# Patient Record
Sex: Male | Born: 1990 | Race: Black or African American | Hispanic: No | Marital: Single | Smoking: Current some day smoker
Health system: Southern US, Community
[De-identification: ages and names within clinical notes are randomized; demographics above are authoritative.]

## PROBLEM LIST (undated history)

## (undated) DIAGNOSIS — Z21 Asymptomatic human immunodeficiency virus [HIV] infection status: Secondary | ICD-10-CM

## (undated) DIAGNOSIS — Z87891 Personal history of nicotine dependence: Secondary | ICD-10-CM

## (undated) DIAGNOSIS — B20 Human immunodeficiency virus [HIV] disease: Secondary | ICD-10-CM

## (undated) HISTORY — DX: Personal history of nicotine dependence: Z87.891

## (undated) HISTORY — PX: THORACOTOMY: SUR1349

---

## 2011-11-20 ENCOUNTER — Ambulatory Visit: Payer: Self-pay

## 2012-02-09 ENCOUNTER — Emergency Department (HOSPITAL_BASED_OUTPATIENT_CLINIC_OR_DEPARTMENT_OTHER)
Admission: EM | Admit: 2012-02-09 | Discharge: 2012-02-10 | Disposition: A | Discharge status: Home / Self Care | Payer: Self-pay | Attending: Emergency Medicine | Admitting: Emergency Medicine

## 2012-02-09 ENCOUNTER — Emergency Department (HOSPITAL_BASED_OUTPATIENT_CLINIC_OR_DEPARTMENT_OTHER)
Admission: EM | Admit: 2012-02-09 | Discharge: 2012-02-09 | Disposition: A | Discharge status: Home / Self Care | Payer: Self-pay | Attending: Emergency Medicine | Admitting: Emergency Medicine

## 2012-02-09 ENCOUNTER — Encounter (HOSPITAL_BASED_OUTPATIENT_CLINIC_OR_DEPARTMENT_OTHER): Payer: Self-pay | Admitting: *Deleted

## 2012-02-09 ENCOUNTER — Emergency Department (INDEPENDENT_AMBULATORY_CARE_PROVIDER_SITE_OTHER): Payer: Self-pay

## 2012-02-09 DIAGNOSIS — J40 Bronchitis, not specified as acute or chronic: Secondary | ICD-10-CM | POA: Insufficient documentation

## 2012-02-09 DIAGNOSIS — J069 Acute upper respiratory infection, unspecified: Secondary | ICD-10-CM

## 2012-02-09 DIAGNOSIS — R071 Chest pain on breathing: Secondary | ICD-10-CM

## 2012-02-09 DIAGNOSIS — R509 Fever, unspecified: Secondary | ICD-10-CM | POA: Insufficient documentation

## 2012-02-09 DIAGNOSIS — F17200 Nicotine dependence, unspecified, uncomplicated: Secondary | ICD-10-CM | POA: Insufficient documentation

## 2012-02-09 DIAGNOSIS — R52 Pain, unspecified: Secondary | ICD-10-CM

## 2012-02-09 DIAGNOSIS — B370 Candidal stomatitis: Secondary | ICD-10-CM | POA: Insufficient documentation

## 2012-02-09 LAB — CBC
HCT: 34.5 % — ABNORMAL LOW (ref 39.0–52.0)
Hemoglobin: 12.5 g/dL — ABNORMAL LOW (ref 13.0–17.0)
MCH: 29.6 pg (ref 26.0–34.0)
MCHC: 36.2 g/dL — ABNORMAL HIGH (ref 30.0–36.0)
MCV: 81.6 fL (ref 78.0–100.0)
Platelets: 343 10*3/uL (ref 150–400)
RBC: 4.23 MIL/uL (ref 4.22–5.81)
RDW: 12.7 % (ref 11.5–15.5)
WBC: 15.4 10*3/uL — ABNORMAL HIGH (ref 4.0–10.5)

## 2012-02-09 LAB — URINALYSIS, ROUTINE W REFLEX MICROSCOPIC
Bilirubin Urine: NEGATIVE
Glucose, UA: NEGATIVE mg/dL
Hgb urine dipstick: NEGATIVE
Ketones, ur: NEGATIVE mg/dL
Leukocytes, UA: NEGATIVE
Nitrite: NEGATIVE
Protein, ur: NEGATIVE mg/dL
Specific Gravity, Urine: 1.01 (ref 1.005–1.030)
Urobilinogen, UA: 1 mg/dL (ref 0.0–1.0)
pH: 6 (ref 5.0–8.0)

## 2012-02-09 LAB — DIFFERENTIAL
Basophils Absolute: 0 10*3/uL (ref 0.0–0.1)
Basophils Relative: 0 % (ref 0–1)
Eosinophils Absolute: 0 10*3/uL (ref 0.0–0.7)
Eosinophils Relative: 0 % (ref 0–5)
Lymphocytes Relative: 16 % (ref 12–46)
Lymphs Abs: 2.5 10*3/uL (ref 0.7–4.0)
Monocytes Absolute: 1.8 10*3/uL — ABNORMAL HIGH (ref 0.1–1.0)
Monocytes Relative: 12 % (ref 3–12)
Neutro Abs: 11.1 10*3/uL — ABNORMAL HIGH (ref 1.7–7.7)
Neutrophils Relative %: 72 % (ref 43–77)

## 2012-02-09 LAB — T-HELPER CELLS (CD4) COUNT (NOT AT ARMC)
CD4 % Helper T Cell: 20 % — ABNORMAL LOW (ref 33–55)
CD4 T Cell Abs: 530 uL (ref 400–2700)

## 2012-02-09 LAB — LACTATE DEHYDROGENASE: LDH: 254 U/L — ABNORMAL HIGH (ref 94–250)

## 2012-02-09 LAB — MONONUCLEOSIS SCREEN: Mono Screen: NEGATIVE

## 2012-02-09 MED ORDER — ACETAMINOPHEN 325 MG PO TABS
650.0000 mg | ORAL_TABLET | Freq: Once | ORAL | Status: AC
  Administered 2012-02-09: 650 mg via ORAL

## 2012-02-09 MED ORDER — ACETAMINOPHEN 325 MG PO TABS
ORAL_TABLET | ORAL | Status: AC
  Administered 2012-02-09: 650 mg via ORAL
  Filled 2012-02-09: qty 2

## 2012-02-09 MED ORDER — NAPROXEN 250 MG PO TABS
375.0000 mg | ORAL_TABLET | Freq: Once | ORAL | Status: AC
  Administered 2012-02-09: 375 mg via ORAL
  Filled 2012-02-09: qty 2

## 2012-02-09 MED ORDER — ACETAMINOPHEN 325 MG PO TABS
ORAL_TABLET | ORAL | Status: AC
  Filled 2012-02-09: qty 2

## 2012-02-09 NOTE — ED Provider Notes (Signed)
History     CSN: [id number]  Arrival date & time 02/09/12  4098   First MD Initiated Contact with Patient 02/09/12 0057      Chief Complaint  Patient presents with  . 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 He is here with a chief complaint of fever. The fevers began about a week ago and a been waxing and waning. He took Tylenol earlier in the week with relief of fever. He is here this morning do to return of the fever. It was noted to be 103.2 here. He is not sure how high it has been at home. He did have some diarrhea earlier in the week but that is resolved. He had a sore throat earlier in the week but states that is also resolved. He denies cough, dyspnea, nausea, vomiting, abdominal pain, dysuria or nasal congestion.  History reviewed. No pertinent past medical history.  History reviewed. No pertinent past surgical history.  History reviewed. No pertinent family history.  History  Substance Use Topics  . Smoking status: Current Everyday Smoker    Types: Cigarettes  . Smokeless tobacco: Not on file  . Alcohol Use:       Review of Systems  All other systems reviewed and are negative.    Allergies  Review of patient's allergies indicates no known allergies.  Home Medications  No current outpatient prescriptions on file.  BP 120/72  Pulse 113  Temp(Src) 101 F (38.3 C) (Oral)  Resp 16  Ht 5\' 7"  (1.702 m)  Wt 145 lb (65.772 kg)  BMI 22.71 kg/m2  SpO2 98%  Physical Exam General: Well-developed, well-nourished male in no acute distress; appearance consistent with age of record HENT: normocephalic, atraumatic; no nasal congestion; pharyngeal erythema Eyes: pupils equal round and reactive to light; extraocular muscles intact Neck: supple; anterior and posterior cervical  lymphadenopathy Heart: regular rate and rhythm; tachycardia Lungs: clear to auscultation bilaterally Abdomen: soft; nondistended; nontender; bowel sounds present Extremities: No deformity; full range of motion; pulses normal Neurologic: Awake, alert and oriented; motor function intact in all extremities and symmetric; no facial droop Skin: Warm and dry Psychiatric: Normal mood and affect    ED Course  Procedures (including critical care time)     MDM   Nursing notes and vitals signs, including pulse oximetry, reviewed.  Summary of this visit's results, reviewed by myself:  Labs:  Results for orders placed during the hospital encounter of 02/09/12  RAPID STREP SCREEN      Component Value Range   Streptococcus, Group A Screen (Direct) NEGATIVE  NEGATIVE   CBC      Component Value Range   WBC 15.4 (*) 4.0 - 10.5 (K/uL)   RBC 4.23  4.22 - 5.81 (MIL/uL)   Hemoglobin 12.5 (*) 13.0 - 17.0 (g/dL)   HCT 11.9 (*) 14.7 - 52.0 (%)   MCV 81.6  78.0 - 100.0 (fL)   MCH 29.6  26.0 - 34.0 (pg)   MCHC 36.2 (*) 30.0 - 36.0 (g/dL)   RDW 82.9  56.2 - 13.0 (%)   Platelets 343  150 - 400 (K/uL)  DIFFERENTIAL      Component Value Range   Neutrophils Relative 72  43 - 77 (%)   Lymphocytes Relative 16  12 - 46 (%)   Monocytes Relative 12  3 - 12 (%)   Eosinophils Relative 0  0 - 5 (%)   Basophils Relative 0  0 - 1 (%)   Neutro Abs 11.1 (*) 1.7 - 7.7 (K/uL)   Lymphs Abs 2.5  0.7 - 4.0 (K/uL)   Monocytes Absolute 1.8 (*) 0.1 - 1.0 (K/uL)   Eosinophils Absolute 0.0  0.0 - 0.7 (K/uL)   Basophils Absolute 0.0  0.0 - 0.1 (K/uL)   WBC Morphology ATYPICAL LYMPHOCYTES    MONONUCLEOSIS SCREEN      Component Value Range   Mono Screen NEGATIVE  NEGATIVE   URINALYSIS, ROUTINE W REFLEX MICROSCOPIC      Component Value Range   Color, Urine YELLOW  YELLOW    APPearance CLEAR  CLEAR    Specific Gravity, Urine 1.010  1.005 - 1.030    pH 6.0  5.0 - 8.0    Glucose, UA NEGATIVE  NEGATIVE (mg/dL)   Hgb  urine dipstick NEGATIVE  NEGATIVE    Bilirubin Urine NEGATIVE  NEGATIVE    Ketones, ur NEGATIVE  NEGATIVE (mg/dL)   Protein, ur NEGATIVE  NEGATIVE (mg/dL)   Urobilinogen, UA 1.0  0.0 - 1.0 (mg/dL)   Nitrite NEGATIVE  NEGATIVE    Leukocytes, UA NEGATIVE  NEGATIVE     Imaging Studies: Dg Chest 2 View  02/09/2012  *RADIOLOGY REPORT*  Clinical Data: Fever of unknown origin and body aches for 4 days.  CHEST - 2 VIEW  Comparison: None.  Findings: The heart size and pulmonary vascularity are normal. The lungs appear clear and expanded without focal air space disease or consolidation. No blunting of the costophrenic angles.  No pneumothorax.  IMPRESSION: No evidence of active pulmonary disease.  Original Report Authenticated By: Marlon Pel, M.D.   3:28 AM Patient continues to be awake, alert and in no acute distress. HIV viral load and CD4 count have been ordered and are pending. The flow manager was contacted regarding close followup in the [facility] infectious disease clinic. At this point there is no indication for acute admission to the hospital. He was advised to continue over-the-counter antipyretics as needed, but to return for difficulty breathing, lethargy or other worsening symptoms.        Hanley Seamen, MD 02/09/12 [phone]

## 2012-02-09 NOTE — Discharge Instructions (Signed)
Fever, Adult A fever is a higher than normal body temperature. In an adult, an oral temperature around 98.6 F (37 C) is considered normal. A temperature of 100.4 F (38 C) or higher is generally considered a fever. Mild or moderate fevers generally have no long-term effects and often do not require treatment. Extreme fever (greater than or equal to 106 F or 41.1 C) can cause seizures. The sweating that may occur with repeated or prolonged fever may cause dehydration. Elderly people can develop confusion during a fever. A measured temperature can vary with:  Age.   Time of day.   Method of measurement (mouth, underarm, rectal, or ear).  The fever is confirmed by taking a temperature with a thermometer. Temperatures can be taken different ways. Some methods are accurate and some are not.  An oral temperature is used most commonly. Electronic thermometers are fast and accurate.   An ear temperature will only be accurate if the thermometer is positioned as recommended by the manufacturer.   A rectal temperature is accurate and done for those adults who have a condition where an oral temperature cannot be taken.   An underarm (axillary) temperature is not accurate and not recommended.  Fever is a symptom, not a disease.  CAUSES   Infections commonly cause fever.   Some noninfectious causes for fever include:   Some arthritis conditions.   Some thyroid or adrenal gland conditions.   Some immune system conditions.   Some types of cancer.   A medicine reaction.   High doses of certain street drugs such as methamphetamine.   Dehydration.   Exposure to high outside or room temperatures.   Occasionally, the source of a fever cannot be determined. This is sometimes called a "fever of unknown origin" (FUO).   Some situations may lead to a temporary rise in body temperature that may go away on its own. Examples are:   Surgery.   Intense exercise.  HOME CARE INSTRUCTIONS    Take appropriate medicines for fever. Follow dosing instructions carefully. If you use acetaminophen to reduce the fever, be careful to avoid taking other medicines that also contain acetaminophen.   If an infection is present and antibiotics have been prescribed, take them as directed. Finish them even if you start to feel better.   Rest as needed.   Maintain an adequate fluid intake. To prevent dehydration during an illness with prolonged or recurrent fever, you may need to drink extra fluid.Drink enough fluids to keep your urine clear or pale yellow.   Sponging or bathing with room temperature water may help reduce body temperature. Do not use ice water or alcohol sponge baths.   Dress comfortably, but do not over-bundle.  SEEK MEDICAL CARE IF:   You are unable to keep fluids down.   You develop vomiting or diarrhea.   You are not feeling at least partly better after 3 days.   You develop new symptoms or problems.  SEEK IMMEDIATE MEDICAL CARE IF:   You have shortness of breath or trouble breathing.   You develop excessive weakness.   You are dizzy or you faint.   You are extremely thirsty or you are making little or no urine.   You develop new pain that was not there before (such as in the head, neck, chest, back, or abdomen).   You have persistant vomiting and diarrhea for more than 1 to 2 days.   You develop a stiff neck or your eyes become sensitive to  light.   You develop a skin rash.   You have a fever or persistent symptoms for more than 2 to 3 days.   You have a fever and your symptoms suddenly get worse.  MAKE SURE YOU:   Understand these instructions.   Will watch your condition.   Will get help right away if you are not doing well or get worse.  Document Released: 03/11/2001 Document Revised: 09/04/2011 Document Reviewed: 07/17/2011 [facility] Patient Information 2012 [place], [place].

## 2012-02-09 NOTE — ED Notes (Signed)
Pt reports pain to right flank, states it feels like it is under his rib cage. No meds took at home. Pain started about 5 am this morning. Pain depends on position of pt, pt states he can not take a deep breath because it hurts so bad.

## 2012-02-09 NOTE — ED Notes (Signed)
Pain in his right ribs. Was seen last night for fever.

## 2012-02-09 NOTE — ED Notes (Signed)
Pt presents to ED today with fever of unknown origin for the last 4 days.  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  Pt was taking tylenol at home with last dose 3 days ago.  Pt has not informed family of HIV status.  Pt has fever in triage and c/o generalized body aches.

## 2012-02-09 NOTE — ED Provider Notes (Signed)
History    This chart was scribed for Kalisha Keadle Smitty Cords, MD, MD by Smitty Pluck. The patient was seen in room [facility] and the patient's care was started at 11:46PM.   CSN: [id number]  Arrival date & time 02/09/12  2252   First MD Initiated Contact with Patient 02/09/12 2343      Chief Complaint  Patient presents with  . URI    (Consider location/radiation/quality/duration/timing/severity/associated sxs/prior treatment) Patient is a 21 y.o. male presenting with URI. The history is provided by the patient.  URI The primary symptoms include fever and cough. Primary symptoms do not include fatigue, headaches, ear pain, swollen glands, wheezing, nausea, vomiting, myalgias, arthralgias or rash. The current episode started 6 to 7 days ago. This is a new problem. The problem has not changed since onset. Associated with: none. The illness is not associated with chills, plugged ear sensation, facial pain, sinus pressure, congestion or rhinorrhea. Risk factors for severe complications from URI include immunosuppression.   Adarius Tigges is a 21 y.o. male who presents to the Emergency Department complaining of moderate cough onset today. Pt was seen in ED last night for fever that has subsided since then. He has right side rib pain. He has not taken any medication PTA. Symptoms have been constant without radiation. He reports having sore throat and loss of appetite.   History reviewed. No pertinent past medical history.  History reviewed. No pertinent past surgical history.  No family history on file.  History  Substance Use Topics  . Smoking status: Current Everyday Smoker    Types: Cigarettes  . Smokeless tobacco: Not on file  . Alcohol Use:       Review of Systems  Constitutional: Positive for fever. Negative for chills and fatigue.  HENT: Negative for ear pain, congestion, rhinorrhea, neck pain, neck stiffness and sinus pressure.   Eyes: Negative for photophobia.  Respiratory:  Positive for cough. Negative for wheezing.   Gastrointestinal: Negative for nausea and vomiting.  Musculoskeletal: Negative for myalgias and arthralgias.  Skin: Negative for rash.  Neurological: Negative for headaches.  All other systems reviewed and are negative.   10 Systems reviewed and all are negative for acute change except as noted in the HPI.   Allergies  Review of patient's allergies indicates no known allergies.  Home Medications   Current Outpatient Rx  Name Route Sig Dispense Refill  . ACETAMINOPHEN 500 MG PO TABS Oral Take 500 mg by mouth every 6 (six) hours as needed. Patient used this medication for his fever.      BP 100/60  Pulse 109  Temp(Src) 99.8 F (37.7 C) (Oral)  Resp 18  Wt 145 lb (65.772 kg)  SpO2 100%  Physical Exam  Nursing note and vitals reviewed. Constitutional: He is oriented to person, place, and time. He appears well-developed and well-nourished. No distress.  HENT:  Head: Normocephalic and atraumatic.       Pt has thrush of tongue  Eyes: Conjunctivae are normal. Pupils are equal, round, and reactive to light.  Neck: Normal range of motion. Neck supple. No tracheal deviation present.       Trachea is midline  Cardiovascular: Normal rate, regular rhythm and normal heart sounds.   Pulmonary/Chest: Effort normal and breath sounds normal. No respiratory distress. He has no wheezes. He exhibits tenderness (right rib ).  Abdominal: Soft. He exhibits no distension. There is no tenderness. There is no rebound.  Musculoskeletal: Normal range of motion. He exhibits no tenderness.  Lymphadenopathy:  He has no cervical adenopathy.  Neurological: He is alert and oriented to person, place, and time.  Skin: Skin is warm and dry.  Psychiatric: He has a normal mood and affect. His behavior is normal.    ED Course  Procedures (including critical care time) DIAGNOSTIC STUDIES: Oxygen Saturation is 100% on room air, normal by my interpretation.     COORDINATION OF CARE: 11:51PM EDP discusses pt ED treatment with pt   Labs Reviewed - No data to display Dg Chest 2 View  02/10/2012  *RADIOLOGY REPORT*  Clinical Data: Pleuritic right chest pain.  CHEST - 2 VIEW  Comparison: 02/09/2012 at 1:38 a.m.  Findings: Cardiac and mediastinal contours appear normal.  The lungs appear clear.  No pleural effusion is identified.  IMPRESSION:  No significant abnormality identified.  Original Report Authenticated By: Dellia Cloud, M.D.   Dg Chest 2 View  02/09/2012  *RADIOLOGY REPORT*  Clinical Data: Fever of unknown origin and body aches for 4 days.  CHEST - 2 VIEW  Comparison: None.  Findings: The heart size and pulmonary vascularity are normal. The lungs appear clear and expanded without focal air space disease or consolidation. No blunting of the costophrenic angles.  No pneumothorax.  IMPRESSION: No evidence of active pulmonary disease.  Original Report Authenticated By: Marlon Pel, M.D.     No diagnosis found.    MDM  Given week of fevers and cough will treat for bronchitis and and chest wall pain related to same.  Will also treat for thrush.  Return for chest pain, shortness or breath, fevers, or any  Concerns.  Follow up with your family doctor in 2 days for recheck take all antibiotics as directed.    I personally performed the services described in this documentation, which was scribed in my presence. The recorded information has been reviewed and considered.         Jasmine Awe, MD 02/10/12 [phone]

## 2012-02-10 LAB — HIV-1 RNA QUANT-NO REFLEX-BLD
HIV 1 RNA Quant: 8876 copies/mL — ABNORMAL HIGH (ref <20)
HIV-1 RNA Quant, Log: 3.95 {Log} — ABNORMAL HIGH (ref <1.30)

## 2012-02-10 MED ORDER — LEVOFLOXACIN 750 MG PO TABS: 750.0000 mg | ORAL_TABLET | Freq: Every day | ORAL | Status: AC

## 2012-02-10 MED ORDER — IBUPROFEN 800 MG PO TABS: 800.0000 mg | ORAL_TABLET | Freq: Three times a day (TID) | ORAL | Status: AC

## 2012-02-10 MED ORDER — NYSTATIN 100000 UNIT/ML MT SUSP: 500000.0000 [IU] | Freq: Four times a day (QID) | OROMUCOSAL | Status: AC

## 2012-02-10 NOTE — ED Notes (Signed)
Jason at [facility] was notified about Positive results and he will have field service person contact [place] for follow up.

## 2012-02-10 NOTE — Discharge Instructions (Signed)
Bronchitis Bronchitis is a problem of the air tubes leading to your lungs. This problem makes it hard for air to get in and out of the lungs. You may cough a lot because your air tubes are narrow. Going without care can cause lasting (chronic) bronchitis. HOME CARE   Drink enough fluids to keep your pee (urine) clear or pale yellow.   Use a cool mist humidifier.   Quit smoking if you smoke. If you keep smoking, the bronchitis might not get better.   Only take medicine as told by your doctor.  GET HELP RIGHT AWAY IF:   Coughing keeps you awake.   You start to wheeze.   You become more sick or weak.   You have a hard time breathing or get short of breath.   You cough up blood.   Coughing lasts more than 2 weeks.   You have a fever.   Your baby is older than 3 months with a rectal temperature of 102 F (38.9 C) or higher.   Your baby is 57 months old or younger with a rectal temperature of 100.4 F (38 C) or higher.  MAKE SURE YOU:  Understand these instructions.   Will watch your condition.   Will get help right away if you are not doing well or get worse.  Document Released: 03/03/2008 Document Revised: 09/04/2011 Document Reviewed: 08/17/2009 [facility] Patient Information 2012 [place], [place].Thrush, Adult  Douglas Valenzuela is a yeast infection that develops in the mouth and throat and on the tongue. The medical term for this is oropharyngeal candidiasis, or OPC. Douglas Valenzuela is most common in older adults, but it can occur at any age. Douglas Valenzuela occurs when a yeast called candida grows out of control. Candida normally is present in small amounts in the mouth and on other mucous membranes. However, under certain circumstances, candida can grow rapidly, causing thrush. Douglas Valenzuela can be a recurring problem for people who have chronic illnesses or who take medications that limit the body's ability to fight infection (weakened immune system). Since these people have difficulty fighting infections, the  fungus that causes thrush can spread throughout the body. This can cause life-threatening blood or organ infections. CAUSES  Candida, the yeast that causes thrush, is normally present in small amounts in the mouth and on other mucous membranes. It usually causes no harm. However, when conditions are present that allow the yeast to grow uncontrolled, it invades surrounding tissues and becomes an infection. Douglas Valenzuela is most commonly caused by the yeast Candida albicans. Less often, other forms of candida can lead to thrush. There are many types of bacteria in your mouth that normally control the growth of candida. Sometimes a new type of bacteria gets into your mouth and disrupts the balance of the germs already there. This can allow candida to overgrow. Other factors that increase your risk of developing thrush include:  An impaired ability to fight infection (weakened immune system). A normal immune system is usually strong enough to prevent candida from overgrowing.   Older adults are more likely to develop thrush because they may have weaker immune systems.   People with human immunodeficiency virus (HIV) infection have a high likelihood of developing thrush. About 90% of people with HIV develop thrush at some point during the course of their disease.   People with diabetes are more likely to get thrush because high blood sugar levels promote overgrowth of the candida fungus.   A dry mouth (xerostomia). Dry mouth can result from overuse of  mouthwashes or from certain conditions such as Sjgren's syndrome.   Pregnancy. Hormone changes during pregnancy can lead to thrush by altering the balance of bacteria in the mouth.   Poor dental care, especially in people who have false teeth.   The use of antibiotic medications. This may lead to thrush by changing the balance of bacteria in the mouth.  SYMPTOMS  Thrush can be a mild infection that causes no symptoms. If symptoms develop, they may include the  following:  A burning feeling in the mouth and throat. This can occur at the start of a thrush infection.   White patches that adhere to the mouth and tongue. The tissue around the patches may be red, raw, and painful. If rubbed (during tooth brushing, for example), the patches and the tissue of the mouth may bleed easily.   A bad taste in the mouth or difficulty tasting foods.   Cottony feeling in the mouth.   Sometimes pain during eating and swallowing.  DIAGNOSIS  Your caregiver can usually diagnose thrush by exam. In addition to looking in your mouth, your caregiver will ask you questions about your health. TREATMENT  Medications that help prevent the growth of fungi (antifungals) are the standard treatment for thrush. These medications are either applied directly to the affected area (topical) or swallowed (oral). Mild thrush In adults, mild cases of thrush may clear up with simple treatment that can be done at home. This treatment usually involves using an antifungal mouth rinse or lozenges. Treatment usually lasts about 14 days. Moderate to severe thrush  More severe thrush infections that have spread to the esophagus are treated with an oral antifungal medication. A topical antifungal medication may also be used.   For some severe infections, a treatment period longer than 14 days may be needed.   Oral antifungal medications are almost never used during pregnancy because the fetus may be harmed. However, if a pregnant woman has a rare, severe thrush infection that has spread to her blood, oral antifungal medications may be used. In this case, the risk of harm to the mother and fetus from the severe thrush infection may be greater than the risk posed by the use of antifungal medications.  Persistent or recurrent thrush Persistent (does not go away) or recurrent (keeps coming back) cases of thrush may:  Need to be treated twice as long as the symptoms last.   Require treatment with  both oral and topical antifungal medications.   People with weakened immune systems can take an antifungal medication on a continuous basis to prevent thrush infections.  It is important to treat conditions that make you more likely to get thrush, such as diabetes, human immunodeficiency virus (HIV), or cancer.  HOME CARE INSTRUCTIONS   If you are breast-feeding, you should clean your nipples with an antifungal medication, such as nystatin (Mycostatin). Dry your nipples after breast-feeding. Applying lanolin-containing body lotion may help relieve nipple soreness.   If you wear dentures and get thrush, remove dentures before going to bed, brush them vigorously, and soak in a solution of chlorhexidine gluconate or a product such as Polident or Efferdent.   Eating plain, unflavored yogurt that contains live cultures (check the label) can also help cure thrush. Yogurt helps healthy bacteria grow in the mouth. These bacteria stop the growth of the yeast that causes thrush.   Adults can treat thrush at home with gentian violet (1%), a dye that kills bacteria and fungi. It is available without a  prescription. If there is no known cause for the infection or if gentian violet does not cure the thrush, you need to see your caregiver.  Comfort measures Measures can be taken to reduce the discomfort of thrush:  Drink cold liquids such as water or iced tea. Eat flavored ice treats or frozen juices.   Eat foods that are easy to swallow such as gelatin, ice cream, or custard.   If the patches are painful, try drinking from a straw.   Rinse your mouth several times a day with a warm saltwater rinse. You can make the saltwater mixture with 1 tsp (5 g) of salt in 8 fl oz (0.2 L) of warm water.  PROGNOSIS   Most cases of thrush are mild and clear up with the use of an antifungal mouth rinse or lozenges. Very mild cases of thrush may clear up without medical treatment. It usually takes about 14 days of  treatment with an oral antifungal medication to cure more severe thrush infections. In some cases, thrush may last several weeks even with treatment.   If thrush goes untreated and does not go away by itself, it can spread to other parts of the body.   Thrush can spread to the throat, the vagina, or the skin. It rarely spreads to other organs of the body.  Douglas Valenzuela is more likely to recur (come back) in:  People who use inhaled corticosteroids to treat asthma.   People who take antibiotic medications for a long time.   People who have false teeth.   People who have a weakened immune system.  RISKS AND COMPLICATIONS Complications related to thrush are rare in healthy people. There are several factors that can increase your risk of developing thrush. Age Older adults, especially those who have serious health problems, are more likely to develop thrush because their immune systems are likely to be weaker. Behavior  The yeast that causes thrush can be spread by oral sex.   Heavy smoking can lower the body's ability to fight off infections. This makes thrush more likely to develop.  Other conditions  False teeth (dentures), braces, or a retainer that irritates the mouth make it hard to keep the mouth clean. An unclean mouth is more likely to develop thrush than a clean mouth.   People with a weakened immune system, such as those who have diabetes or human immunodeficiency virus (HIV) or who are undergoing chemotherapy, have an increased risk for developing thrush.  Medications Some medications can allow the fungus that causes thrush to grow uncontrolled. Common ones are:  Antibiotics, especially those that kill a wide range of organisms (broad-spectrum antibiotics), such as tetracycline commonly can cause thrush.   Birth control pills (oral contraceptives).   Medications that weaken the body's immune system, such as corticosteroids.  Environment Exposure over time to certain  environmental chemicals, such as benzene and pesticides, can weaken the body's immune system. This increases your risk for developing infections, including thrush. SEEK IMMEDIATE MEDICAL CARE IF:  Your symptoms are getting worse or are not improving within 7 days of starting treatment.   You have symptoms of spreading infection, such as white patches on the skin outside of the mouth.   You are nursing and you have redness and pain in the nipples in spite of home treatment or if you have burning pain in the nipple area when you nurse. Your baby's mouth should also be examined to determine whether thrush is causing your symptoms.  Document Released:  06/10/2004 Document Revised: 09/04/2011 Document Reviewed: 09/20/2008 [facility] Patient Information 2012 [place], [place].

## 2012-02-25 ENCOUNTER — Telehealth: Payer: Self-pay

## 2012-02-25 NOTE — Telephone Encounter (Signed)
Pt given appointment for March 02, 2012 @ 9:00 am . Laurell Josephs, RN

## 2012-02-27 ENCOUNTER — Telehealth: Payer: Self-pay

## 2012-02-27 NOTE — Telephone Encounter (Signed)
Called patient and left message about RW/ADAP and to call me back before intake on Tuesday as to what to bring. Also asked if patient had insurance, medicaid, etc.

## 2012-03-02 ENCOUNTER — Ambulatory Visit: Payer: Self-pay

## 2012-03-02 ENCOUNTER — Other Ambulatory Visit: Payer: Self-pay | Admitting: Internal Medicine

## 2012-03-02 ENCOUNTER — Ambulatory Visit (INDEPENDENT_AMBULATORY_CARE_PROVIDER_SITE_OTHER): Payer: Self-pay

## 2012-03-02 DIAGNOSIS — Z23 Encounter for immunization: Secondary | ICD-10-CM

## 2012-03-02 DIAGNOSIS — Z113 Encounter for screening for infections with a predominantly sexual mode of transmission: Secondary | ICD-10-CM

## 2012-03-02 DIAGNOSIS — A630 Anogenital (venereal) warts: Secondary | ICD-10-CM

## 2012-03-02 DIAGNOSIS — Z79899 Other long term (current) drug therapy: Secondary | ICD-10-CM

## 2012-03-02 DIAGNOSIS — B20 Human immunodeficiency virus [HIV] disease: Secondary | ICD-10-CM

## 2012-03-02 LAB — COMPLETE METABOLIC PANEL WITH GFR
Albumin: 2.8 g/dL — ABNORMAL LOW (ref 3.5–5.2)
Alkaline Phosphatase: 109 U/L (ref 39–117)
BUN: 12 mg/dL (ref 6–23)
CO2: 25 mEq/L (ref 19–32)
Calcium: 8.2 mg/dL — ABNORMAL LOW (ref 8.4–10.5)
GFR, Est African American: >89 mL/min
GFR, Est Non African American: >89 mL/min
Glucose, Bld: 110 mg/dL — ABNORMAL HIGH (ref 70–99)
Potassium: 4.1 mEq/L (ref 3.5–5.3)
Total Protein: 8.1 g/dL (ref 6.0–8.3)

## 2012-03-02 LAB — CBC WITH DIFFERENTIAL/PLATELET
Basophils Absolute: 0 10*3/uL (ref 0.0–0.1)
Eosinophils Absolute: 0 10*3/uL (ref 0.0–0.7)
Eosinophils Relative: 0 % (ref 0–5)
Lymphocytes Relative: 11 % — ABNORMAL LOW (ref 12–46)
MCV: 81.4 fL (ref 78.0–100.0)
Neutrophils Relative %: 83 % — ABNORMAL HIGH (ref 43–77)
Platelets: 447 10*3/uL — ABNORMAL HIGH (ref 150–400)
RDW: 13.7 % (ref 11.5–15.5)
WBC: 10.6 10*3/uL — ABNORMAL HIGH (ref 4.0–10.5)

## 2012-03-02 LAB — LIPID PANEL
Cholesterol: 73 mg/dL (ref 0–200)
Total CHOL/HDL Ratio: 10.4 Ratio
Triglycerides: 76 mg/dL (ref <150)
VLDL: 15 mg/dL (ref 0–40)

## 2012-03-02 LAB — RPR

## 2012-03-03 LAB — URINALYSIS
Glucose, UA: NEGATIVE mg/dL
Leukocytes, UA: NEGATIVE
Specific Gravity, Urine: 1.022 (ref 1.005–1.030)
pH: 5.5 (ref 5.0–8.0)

## 2012-03-03 LAB — T-HELPER CELL (CD4) - (RCID CLINIC ONLY)
CD4 % Helper T Cell: 32 % — ABNORMAL LOW (ref 33–55)
CD4 T Cell Abs: 300 uL — ABNORMAL LOW (ref 400–2700)

## 2012-03-04 LAB — HIV-1 RNA ULTRAQUANT REFLEX TO GENTYP+: HIV-1 RNA Quant, Log: 3.77 {Log} — ABNORMAL HIGH (ref <1.30)

## 2012-03-05 ENCOUNTER — Telehealth: Payer: Self-pay | Admitting: *Deleted

## 2012-03-05 NOTE — Telephone Encounter (Signed)
Patient called to report his Tb results. He reports that it is negative and he has not had any reaction to the test at all. Advised the patient will report this in he immunization record.

## 2012-03-08 ENCOUNTER — Encounter (HOSPITAL_BASED_OUTPATIENT_CLINIC_OR_DEPARTMENT_OTHER): Payer: Self-pay | Admitting: *Deleted

## 2012-03-08 ENCOUNTER — Emergency Department (HOSPITAL_BASED_OUTPATIENT_CLINIC_OR_DEPARTMENT_OTHER): Payer: Self-pay

## 2012-03-08 ENCOUNTER — Emergency Department (HOSPITAL_BASED_OUTPATIENT_CLINIC_OR_DEPARTMENT_OTHER)
Admission: EM | Admit: 2012-03-08 | Discharge: 2012-03-08 | Disposition: A | Discharge status: Home / Self Care | Payer: Self-pay | Attending: Emergency Medicine | Admitting: Emergency Medicine

## 2012-03-08 DIAGNOSIS — Z87891 Personal history of nicotine dependence: Secondary | ICD-10-CM | POA: Insufficient documentation

## 2012-03-08 DIAGNOSIS — Z21 Asymptomatic human immunodeficiency virus [HIV] infection status: Secondary | ICD-10-CM | POA: Insufficient documentation

## 2012-03-08 DIAGNOSIS — R079 Chest pain, unspecified: Secondary | ICD-10-CM | POA: Insufficient documentation

## 2012-03-08 HISTORY — DX: Asymptomatic human immunodeficiency virus (hiv) infection status: Z21

## 2012-03-08 HISTORY — DX: Human immunodeficiency virus (HIV) disease: B20

## 2012-03-08 NOTE — ED Provider Notes (Signed)
History     CSN: [id number]  Arrival date & time 03/08/12  0920   First MD Initiated Contact with Patient 03/08/12 [phone]      No chief complaint on file.   (Consider location/radiation/quality/duration/timing/severity/associated sxs/prior treatment) HPI Comments: Patient presents complaining of right-sided chest pain that occurred while sleeping last night.  He states that when he awoke this morning he had a rollover very slowly due to the pain.  The pain is not completely resolved.  He had no shortness of breath, cough, fevers, nausea with it.  Patient has not had this pain before.  Patient has no abdominal pain.  Patient presents because she's concerned about what might have caused the pain last night.  The history is provided by the patient.    Past Medical History  Diagnosis Date  . HIV (human immunodeficiency virus infection)     History reviewed. No pertinent past surgical history.  History reviewed. No pertinent family history.  History  Substance Use Topics  . Smoking status: Former Smoker    Types: Cigarettes  . Smokeless tobacco: Not on file  . Alcohol Use: No      Review of Systems  Constitutional: Negative.  Negative for fever and chills.  HENT: Negative.   Eyes: Negative.  Negative for discharge and redness.  Respiratory: Negative.  Negative for cough and shortness of breath.   Cardiovascular: Positive for chest pain.  Gastrointestinal: Negative.  Negative for nausea, vomiting and abdominal pain.  Genitourinary: Negative.   Musculoskeletal: Negative.  Negative for back pain.  Skin: Negative.  Negative for color change and rash.  Neurological: Negative.   Hematological: Negative.  Negative for adenopathy.  Psychiatric/Behavioral: Negative.  Negative for confusion.  All other systems reviewed and are negative.    Allergies  Review of patient's allergies indicates no known allergies.  Home Medications   Current Outpatient Rx  Name Route Sig  Dispense Refill  . ACETAMINOPHEN 500 MG PO TABS Oral Take 500 mg by mouth every 6 (six) hours as needed. Patient used this medication for his fever.      BP 119/72  Pulse 102  Temp(Src) 99.6 F (37.6 C) (Oral)  Resp 20  SpO2 100%  Physical Exam  Nursing note and vitals reviewed. Constitutional: He is oriented to person, place, and time. He appears well-developed and well-nourished.  Non-toxic appearance. He does not have a sickly appearance.  HENT:  Head: Normocephalic and atraumatic.  Eyes: Conjunctivae, EOM and lids are normal. Pupils are equal, round, and reactive to light.  Neck: Trachea normal, normal range of motion and full passive range of motion without pain. Neck supple.  Cardiovascular: Normal rate, regular rhythm and normal heart sounds.   Pulmonary/Chest: Effort normal. No respiratory distress. He has no wheezes. He has no rales. He exhibits no tenderness.  Abdominal: Soft. Normal appearance. He exhibits no distension. There is no tenderness. There is no rebound and no CVA tenderness.  Musculoskeletal: Normal range of motion.  Neurological: He is alert and oriented to person, place, and time. He has normal strength.  Skin: Skin is warm, dry and intact. No rash noted.  Psychiatric: He has a normal mood and affect. His behavior is normal. Judgment and thought content normal.    ED Course  Procedures (including critical care time)  Dg Chest 2 View  03/08/2012  *RADIOLOGY REPORT*  Clinical Data: Right upper chest pain.  CHEST - 2 VIEW  Comparison: 02/09/2012.  Findings: Asymmetric elevation of the right hemidiaphragm is more  pronounced on the current study than on the previous exam.  The lungs are clear. The cardiopericardial silhouette is within normal limits for size. Imaged bony structures of the thorax are intact.  IMPRESSION: Asymmetric elevation of the right hemidiaphragm has progressed in the interval and may be related to splinting given the history of right-sided  pain. Phrenic nerve injury could also produce this appearance.  Otherwise no acute cardiopulmonary process.  Original Report Authenticated By: ERIC A. MANSELL, M.D.       Date: 03/08/2012  Rate: 92  Rhythm: normal sinus rhythm  QRS Axis: normal  Intervals: normal  ST/T Wave abnormalities: normal  Conduction Disutrbances:none  Narrative Interpretation:   Old EKG Reviewed: none available    MDM  Patient with unclear etiology for his chest pain that occurred while sleeping.  He does not have pain now.  He has a normal EKG and normal chest x-ray.  His lungs are clear.  He has a low-grade fever without correlating symptoms to suggest infection.  Patient does not have continued pain or pleuritic pain to suggest PE.  The patient be safely discharged return for worsening or persistent pain.        Nat Christen, MD 03/08/12 1034

## 2012-03-08 NOTE — Discharge Instructions (Signed)
Chest Pain (Nonspecific) It is often hard to give a specific diagnosis for the cause of chest pain. There is always a chance that your pain could be related to something serious, such as a heart attack or a blood clot in the lungs. You need to follow up with your caregiver for further evaluation. CAUSES   Heartburn.   Pneumonia or bronchitis.   Anxiety or stress.   Inflammation around your heart (pericarditis) or lung (pleuritis or pleurisy).   A blood clot in the lung.   A collapsed lung (pneumothorax). It can develop suddenly on its own (spontaneous pneumothorax) or from injury (trauma) to the chest.   Shingles infection (herpes zoster virus).  The chest wall is composed of bones, muscles, and cartilage. Any of these can be the source of the pain.  The bones can be bruised by injury.   The muscles or cartilage can be strained by coughing or overwork.   The cartilage can be affected by inflammation and become sore (costochondritis).  DIAGNOSIS  Lab tests or other studies, such as X-rays, electrocardiography, stress testing, or cardiac imaging, may be needed to find the cause of your pain.  TREATMENT   Treatment depends on what may be causing your chest pain. Treatment may include:   Acid blockers for heartburn.   Anti-inflammatory medicine.   Pain medicine for inflammatory conditions.   Antibiotics if an infection is present.   You may be advised to change lifestyle habits. This includes stopping smoking and avoiding alcohol, caffeine, and chocolate.   You may be advised to keep your head raised (elevated) when sleeping. This reduces the chance of acid going backward from your stomach into your esophagus.   Most of the time, nonspecific chest pain will improve within 2 to 3 days with rest and mild pain medicine.  HOME CARE INSTRUCTIONS   If antibiotics were prescribed, take your antibiotics as directed. Finish them even if you start to feel better.   For the next few  days, avoid physical activities that bring on chest pain. Continue physical activities as directed.   Do not smoke.   Avoid drinking alcohol.   Only take over-the-counter or prescription medicine for pain, discomfort, or fever as directed by your caregiver.   Follow your caregiver's suggestions for further testing if your chest pain does not go away.   Keep any follow-up appointments you made. If you do not go to an appointment, you could develop lasting (chronic) problems with pain. If there is any problem keeping an appointment, you must call to reschedule.  SEEK MEDICAL CARE IF:   You think you are having problems from the medicine you are taking. Read your medicine instructions carefully.   Your chest pain does not go away, even after treatment.   You develop a rash with blisters on your chest.  SEEK IMMEDIATE MEDICAL CARE IF:   You have increased chest pain or pain that spreads to your arm, neck, jaw, back, or abdomen.   You develop shortness of breath, an increasing cough, or you are coughing up blood.   You have severe back or abdominal pain, feel nauseous, or vomit.   You develop severe weakness, fainting, or chills.   You have a fever.  THIS IS AN EMERGENCY. Do not wait to see if the pain will go away. Get medical help at once. Call your local emergency services (911 in [place] Do not drive yourself to the hospital. MAKE SURE YOU:   Understand these instructions.     Will watch your condition.   Will get help right away if you are not doing well or get worse.  Document Released: 06/25/2005 Document Revised: 09/04/2011 Document Reviewed: 04/20/2008 [employer] Patient Information 2012 [employer]. 

## 2012-03-10 DIAGNOSIS — A630 Anogenital (venereal) warts: Secondary | ICD-10-CM | POA: Insufficient documentation

## 2012-03-10 DIAGNOSIS — B20 Human immunodeficiency virus [HIV] disease: Secondary | ICD-10-CM | POA: Insufficient documentation

## 2012-03-11 LAB — HIV-1 GENOTYPR PLUS

## 2012-03-15 ENCOUNTER — Other Ambulatory Visit: Payer: Self-pay | Admitting: *Deleted

## 2012-03-23 ENCOUNTER — Telehealth: Payer: Self-pay

## 2012-03-23 ENCOUNTER — Ambulatory Visit (HOSPITAL_COMMUNITY)
Admission: RE | Admit: 2012-03-23 | Discharge: 2012-03-23 | Disposition: A | Discharge status: Home / Self Care | Payer: Self-pay | Source: Ambulatory Visit | Attending: Internal Medicine | Admitting: Internal Medicine

## 2012-03-23 ENCOUNTER — Other Ambulatory Visit: Payer: Self-pay

## 2012-03-23 ENCOUNTER — Encounter: Payer: Self-pay | Admitting: Internal Medicine

## 2012-03-23 ENCOUNTER — Ambulatory Visit: Payer: Self-pay

## 2012-03-23 ENCOUNTER — Ambulatory Visit (INDEPENDENT_AMBULATORY_CARE_PROVIDER_SITE_OTHER): Payer: Self-pay | Admitting: Internal Medicine

## 2012-03-23 VITALS — BP 117/75 | HR 133 | Temp 101.0°F | Ht 67.0 in | Wt 137.0 lb

## 2012-03-23 DIAGNOSIS — R000 Tachycardia, unspecified: Secondary | ICD-10-CM | POA: Insufficient documentation

## 2012-03-23 DIAGNOSIS — Z23 Encounter for immunization: Secondary | ICD-10-CM

## 2012-03-23 DIAGNOSIS — B1910 Unspecified viral hepatitis B without hepatic coma: Secondary | ICD-10-CM | POA: Insufficient documentation

## 2012-03-23 DIAGNOSIS — B20 Human immunodeficiency virus [HIV] disease: Secondary | ICD-10-CM

## 2012-03-23 MED ORDER — ELVITEG-COBIC-EMTRICIT-TENOFDF 150-150-200-300 MG PO TABS: 1.0000 | ORAL_TABLET | Freq: Every day | ORAL | Status: DC

## 2012-03-23 NOTE — Assessment & Plan Note (Addendum)
I discussed the different treatment options with him at length. Including a one pill daily regimens as well as TIA based regimens. I discussed the advantages of both including the higher barrier to resistance with EPI regimens and the need to take the medications at the same time everyday particularly with the one pill a day options. He does feel he will be able to take the medication at the same time everyday and appears motivated to start and be compliant. Therefore we have opted to start Stribild. As soon as he does get the medication, he will start and have his labs checked about 3 weeks after starting. I will then see him one to 2 weeks after the lab visit. He knows to call for any significant complaints. He knows not to stop the medication without talking to [place] first.  I also reminded him that we will see him anytime during the week to address any acute issues and call us for nonemergent acute issues. He was reminded to use condoms with all sexual activity. He will get hepatitis A vaccine series.

## 2012-03-23 NOTE — Assessment & Plan Note (Signed)
He is tachycardic today. He is otherwise though asymptomatic. I did check an EKG, this is consistent with sinus tachycardia, no signs of SVT or other concerning findings. This is likely related to his fever. He has asymptomatic in regards to his fever and this is likely related to his HIV. This will be followed and he knows to call in if he has any significant complaints or new symptoms. I will check a TSH next visit.

## 2012-03-23 NOTE — Progress Notes (Signed)
  Subjective:    Patient ID: Douglas Valenzuela, male    DOB: 09/14/1991, 21 y.o.   MRN: [id number]  HPI He comes in as a new patient with HIV. He was diagnosed at the [facility]. He was also noted to be hepatitis B antibody positive and hepatitis A antibody negative. His hepatitis C negative. His risk factors are MSM. He does have a current active partner who does his status. He has no other associated medical problems. He has complained of recent fatigue and has had some pleuritic chest pain. His chest pain occurs with breathing and is intermittent and is reproducible. He has been diagnosed recently with bronchitis. He denies any history of other STI's. He has no specific questions related to HIV and is interested in treatment today.   Review of Systems  Constitutional: Positive for fatigue. Negative for fever, chills, appetite change and unexpected weight change.  HENT: Negative for sore throat and trouble swallowing.   Respiratory: Negative for cough, chest tightness, shortness of breath, wheezing and stridor.   Cardiovascular: Negative for chest pain, palpitations and leg swelling.  Gastrointestinal: Negative for nausea, abdominal pain and diarrhea.  Genitourinary: Negative for discharge and genital sores.  Musculoskeletal: Negative for myalgias, joint swelling and arthralgias.  Skin: Negative for rash.  Neurological: Negative for dizziness, numbness and headaches.  Hematological: Negative for adenopathy.  Psychiatric/Behavioral: Negative for dysphoric mood. The patient is not nervous/anxious.        Objective:   Physical Exam        Assessment & Plan:

## 2012-03-23 NOTE — Telephone Encounter (Signed)
Called patient to be sure he knew he would have to recertify for ADAP between July and Sept 2013 - he just now is on ADAP, but will have to recerify during the summer period - he said he would call back.

## 2012-04-05 ENCOUNTER — Other Ambulatory Visit: Payer: Self-pay | Admitting: *Deleted

## 2012-04-05 DIAGNOSIS — B20 Human immunodeficiency virus [HIV] disease: Secondary | ICD-10-CM

## 2012-04-05 MED ORDER — ELVITEG-COBIC-EMTRICIT-TENOFDF 150-150-200-300 MG PO TABS: 1.0000 | ORAL_TABLET | Freq: Every day | ORAL | Status: DC

## 2012-04-07 ENCOUNTER — Other Ambulatory Visit: Payer: Self-pay | Admitting: *Deleted

## 2012-04-07 DIAGNOSIS — B20 Human immunodeficiency virus [HIV] disease: Secondary | ICD-10-CM

## 2012-04-07 MED ORDER — ELVITEG-COBIC-EMTRICIT-TENOFDF 150-150-200-300 MG PO TABS: 1.0000 | ORAL_TABLET | Freq: Every day | ORAL | Status: DC

## 2012-04-28 ENCOUNTER — Encounter: Payer: Self-pay | Admitting: Internal Medicine

## 2012-04-28 ENCOUNTER — Ambulatory Visit (INDEPENDENT_AMBULATORY_CARE_PROVIDER_SITE_OTHER): Payer: Self-pay | Admitting: Internal Medicine

## 2012-04-28 ENCOUNTER — Telehealth: Payer: Self-pay | Admitting: *Deleted

## 2012-04-28 VITALS — BP 123/85 | HR 122 | Temp 97.9°F | Ht 67.0 in | Wt 139.0 lb

## 2012-04-28 DIAGNOSIS — R000 Tachycardia, unspecified: Secondary | ICD-10-CM

## 2012-04-28 DIAGNOSIS — K750 Abscess of liver: Secondary | ICD-10-CM | POA: Insufficient documentation

## 2012-04-28 DIAGNOSIS — B20 Human immunodeficiency virus [HIV] disease: Secondary | ICD-10-CM

## 2012-04-28 LAB — CBC WITH DIFFERENTIAL/PLATELET
Basophils Absolute: 0 10*3/uL (ref 0.0–0.1)
Eosinophils Relative: 2 % (ref 0–5)
Lymphocytes Relative: 48 % — ABNORMAL HIGH (ref 12–46)
Lymphs Abs: 3.8 10*3/uL (ref 0.7–4.0)
MCV: 83.3 fL (ref 78.0–100.0)
Neutro Abs: 3.1 10*3/uL (ref 1.7–7.7)
Neutrophils Relative %: 41 % — ABNORMAL LOW (ref 43–77)
Platelets: 605 10*3/uL — ABNORMAL HIGH (ref 150–400)
RBC: 3.9 MIL/uL — ABNORMAL LOW (ref 4.22–5.81)
RDW: 19.9 % — ABNORMAL HIGH (ref 11.5–15.5)
WBC: 7.9 10*3/uL (ref 4.0–10.5)

## 2012-04-28 MED ORDER — METRONIDAZOLE 500 MG PO TABS: 500.0000 mg | ORAL_TABLET | Freq: Three times a day (TID) | ORAL | Status: AC

## 2012-04-28 MED ORDER — LEVOFLOXACIN 500 MG PO TABS: 500.0000 mg | ORAL_TABLET | Freq: Every day | ORAL | Status: AC

## 2012-04-28 NOTE — Progress Notes (Signed)
  Subjective:    Patient ID: Douglas Valenzuela, male    DOB: 03-Nov-1990, 21 y.o.   MRN: [id number]  HPI Comes in here for hospital followup. This is the second visit of this patient who was newly diagnosed with HIV. He was diagnosed at the health department. He was seen in June of this year and after discussion was going to start on Stribild. He in fact just received it after getting signed up for ADAP.    He though is here for followup after being in [facility] for what turned out to be a pyogenic liver abscess. He was hospitalized for about 3 weeks after presenting with significant shortness of breath and was found to have a pleural effusion and secondarily found the liver abscess which likely was the cause of both. He did require percutaneous drainage and JP drainage of his abscess and also required decortication of his lungs do to reaccumulation. He has seen his thoracic physician who did feel is doing well. He is on Invanz and is going to complete a four-week course. He denies any fever chills and otherwise feels much better.   Review of Systems  Constitutional: Negative for fever, chills, appetite change, fatigue and unexpected weight change.  HENT: Negative for sore throat and trouble swallowing.   Respiratory: Negative for cough, shortness of breath and wheezing.   Cardiovascular: Negative for chest pain, palpitations and leg swelling.  Gastrointestinal: Negative for nausea, abdominal pain and diarrhea.  Musculoskeletal: Negative for myalgias, joint swelling and arthralgias.  Skin: Negative for rash.  Neurological: Negative for dizziness, weakness and headaches.  Hematological: Negative for adenopathy.  Psychiatric/Behavioral: Negative for dysphoric mood. The patient is nervous/anxious.        Objective:   Physical Exam  Constitutional: He appears well-developed and well-nourished. No distress.  HENT:  Mouth/Throat: Oropharynx is clear and moist. No oropharyngeal  exudate.  Cardiovascular: Regular rhythm and normal heart sounds.  Exam reveals no gallop and no friction rub.   No murmur heard.      tachycardic  Pulmonary/Chest: Effort normal and breath sounds normal. No respiratory distress. He has no wheezes. He has no rales.       Scars noted in his right flank  Abdominal: Soft. Bowel sounds are normal. He exhibits no distension and no mass. There is no tenderness. There is no rebound and no guarding.  Lymphadenopathy:    He has no cervical adenopathy.  Skin: No rash noted.          Assessment & Plan:

## 2012-04-28 NOTE — Assessment & Plan Note (Signed)
He was noted to be tachycardic in the hospital as well. This did seem to resolve with the Xanax. He therefore was sent with a prescription of Xanax to take at night. He is tachycardic again today though evaluation at [facility] was not significant. It is not symptomatic. They will be monitored.

## 2012-04-28 NOTE — Telephone Encounter (Signed)
Called [facility] and spoke with Okey Regal, gave verbal order to remove PICC at end of IV antibiotic therapy per Dr. Staci Righter. Wendall Mola CMA

## 2012-04-28 NOTE — Assessment & Plan Note (Addendum)
He does appear to be doing well. He will complete his course of Invanz and I have given him a prescription of continuation of therapy orally with Levaquin and Flagyl. He will then stop once completing that. I have extended the duration 2 weeks do to HIV and since he will be starting his antiretroviral therapy as well.  Ultra stent remain negative at [facility].

## 2012-04-28 NOTE — Assessment & Plan Note (Signed)
He does have his medicine and will start tomorrow. He will return in 4 weeks for labs and I will see him 2 weeks after that.

## 2012-04-29 LAB — COMPLETE METABOLIC PANEL WITH GFR
ALT: 22 U/L (ref 0–53)
AST: 25 U/L (ref 0–37)
Albumin: 3.2 g/dL — ABNORMAL LOW (ref 3.5–5.2)
CO2: 26 mEq/L (ref 19–32)
Calcium: 9.5 mg/dL (ref 8.4–10.5)
Chloride: 102 mEq/L (ref 96–112)
Creat: 0.62 mg/dL (ref 0.50–1.35)
GFR, Est African American: >89 mL/min
Potassium: 4.6 mEq/L (ref 3.5–5.3)
Sodium: 134 mEq/L — ABNORMAL LOW (ref 135–145)
Total Protein: 8.9 g/dL — ABNORMAL HIGH (ref 6.0–8.3)

## 2012-04-29 LAB — C-REACTIVE PROTEIN: CRP: 1.8 mg/dL — ABNORMAL HIGH (ref <0.60)

## 2012-05-12 ENCOUNTER — Telehealth: Payer: Self-pay | Admitting: *Deleted

## 2012-05-12 NOTE — Telephone Encounter (Signed)
Patient called and advised that she is scheduled to have his PICC removed 05/14/12 and was told at his visit that he will be on oral antibiotics at that time. Advised him will check with is provider to see which antibiotic he needs and call him once the decision has been made. Verified his phone number and advised him will be tomorrow as it is almost 5 pm.

## 2012-05-13 ENCOUNTER — Telehealth: Payer: Self-pay | Admitting: Licensed Clinical Social Worker

## 2012-05-13 NOTE — Telephone Encounter (Signed)
Yes, the medications were sent to his pharmacy at the time of the visit.  He should start them now after the picc is removed. levaquin and flagyl.

## 2012-05-13 NOTE — Telephone Encounter (Signed)
Patient called today inquiring about oral antibiotics, message was sent to Dr. Luciana Axe.

## 2012-05-14 NOTE — Telephone Encounter (Signed)
Called patient and advised him that the antibiotics were called in at his visit and are ready at the pharmacy. Patient advised he has no way to get to the pharmacy as he lives in [place]. Advised the patient to call the pharmacy and advise them of this. The pharmacy has a new program and they can probably deliver or mail them to the patient he just has to ask for the service.

## 2012-06-01 ENCOUNTER — Other Ambulatory Visit: Payer: Self-pay

## 2012-06-01 DIAGNOSIS — B20 Human immunodeficiency virus [HIV] disease: Secondary | ICD-10-CM

## 2012-06-01 LAB — CBC WITH DIFFERENTIAL/PLATELET
Basophils Absolute: 0 10*3/uL (ref 0.0–0.1)
HCT: 37.8 % — ABNORMAL LOW (ref 39.0–52.0)
Lymphocytes Relative: 59 % — ABNORMAL HIGH (ref 12–46)
Lymphs Abs: 2.3 10*3/uL (ref 0.7–4.0)
Neutro Abs: 1 10*3/uL — ABNORMAL LOW (ref 1.7–7.7)
Platelets: 290 10*3/uL (ref 150–400)
RBC: 4.64 MIL/uL (ref 4.22–5.81)
RDW: 17.4 % — ABNORMAL HIGH (ref 11.5–15.5)
WBC: 3.8 10*3/uL — ABNORMAL LOW (ref 4.0–10.5)

## 2012-06-02 LAB — COMPLETE METABOLIC PANEL WITH GFR
ALT: 17 U/L (ref 0–53)
AST: 26 U/L (ref 0–37)
Albumin: 4.3 g/dL (ref 3.5–5.2)
CO2: 24 mEq/L (ref 19–32)
Calcium: 9.7 mg/dL (ref 8.4–10.5)
Chloride: 104 mEq/L (ref 96–112)
Potassium: 3.9 mEq/L (ref 3.5–5.3)
Total Protein: 8.3 g/dL (ref 6.0–8.3)

## 2012-06-03 LAB — HIV-1 RNA QUANT-NO REFLEX-BLD: HIV-1 RNA Quant, Log: 1.62 {Log} — ABNORMAL HIGH (ref <1.30)

## 2012-06-17 ENCOUNTER — Ambulatory Visit: Payer: Self-pay | Admitting: Internal Medicine

## 2012-07-15 ENCOUNTER — Ambulatory Visit (INDEPENDENT_AMBULATORY_CARE_PROVIDER_SITE_OTHER): Payer: Self-pay | Admitting: Internal Medicine

## 2012-07-15 ENCOUNTER — Encounter: Payer: Self-pay | Admitting: Internal Medicine

## 2012-07-15 VITALS — BP 122/81 | HR 74 | Temp 97.6°F | Ht 67.0 in | Wt 160.0 lb

## 2012-07-15 DIAGNOSIS — K750 Abscess of liver: Secondary | ICD-10-CM

## 2012-07-15 DIAGNOSIS — B20 Human immunodeficiency virus [HIV] disease: Secondary | ICD-10-CM

## 2012-07-19 NOTE — Assessment & Plan Note (Signed)
Resolved

## 2012-07-19 NOTE — Progress Notes (Signed)
  Subjective:    Patient ID: Douglas Valenzuela, male    DOB: 01-Sep-1991, 21 y.o.   MRN: [id number]  HPI [place] for follow up of HIV.  Started on Stribild after completing ADAP.  Has been on for over a month.  Also recently with liver abscess and completed IV therapy and oral continuation.  Diagnosed and managed in [facility].  No further issues with fever or chills.  Is due to have repeat scan by pulmonary (required chest tube).  Taking Stribild and no missed doses.  Tolerating very well and Virus nearly undetectable.  CD4 in normal range.     Review of Systems  Constitutional: Negative for fever, chills, activity change, appetite change, fatigue and unexpected weight change.  HENT: Negative for sore throat and trouble swallowing.   Respiratory: Negative for cough.   Cardiovascular: Negative for leg swelling.  Gastrointestinal: Negative for nausea, abdominal pain and diarrhea.  Musculoskeletal: Negative for myalgias, joint swelling and arthralgias.  Neurological: Negative for dizziness, light-headedness and headaches.       Objective:   Physical Exam  Constitutional: He appears well-developed and well-nourished. No distress.  Cardiovascular: Normal rate, regular rhythm and normal heart sounds.  Exam reveals no gallop and no friction rub.   No murmur heard. Pulmonary/Chest: Effort normal and breath sounds normal. No respiratory distress. He has no wheezes. He has no rales.  Abdominal: Soft. Bowel sounds are normal. He exhibits no distension. There is no tenderness. There is no rebound.          Assessment & Plan:

## 2012-07-19 NOTE — Assessment & Plan Note (Signed)
Doing well on new regimen.  Knows to use condoms always.  RTC 3 months to assure suppression.

## 2012-10-02 ENCOUNTER — Other Ambulatory Visit: Payer: Self-pay | Admitting: Infectious Diseases

## 2012-10-12 ENCOUNTER — Other Ambulatory Visit: Payer: Self-pay

## 2012-10-14 ENCOUNTER — Other Ambulatory Visit (INDEPENDENT_AMBULATORY_CARE_PROVIDER_SITE_OTHER): Payer: Self-pay

## 2012-10-14 DIAGNOSIS — B20 Human immunodeficiency virus [HIV] disease: Secondary | ICD-10-CM

## 2012-10-15 LAB — T-HELPER CELL (CD4) - (RCID CLINIC ONLY): CD4 % Helper T Cell: 27 % — ABNORMAL LOW (ref 33–55)

## 2012-10-26 ENCOUNTER — Ambulatory Visit: Payer: Self-pay | Admitting: Internal Medicine

## 2012-10-27 ENCOUNTER — Other Ambulatory Visit: Payer: Self-pay | Admitting: *Deleted

## 2012-10-27 DIAGNOSIS — B20 Human immunodeficiency virus [HIV] disease: Secondary | ICD-10-CM

## 2012-10-27 MED ORDER — ELVITEG-COBIC-EMTRICIT-TENOFDF 150-150-200-300 MG PO TABS: 1.0000 | ORAL_TABLET | Freq: Every day | ORAL | Status: DC

## 2012-11-02 ENCOUNTER — Ambulatory Visit: Payer: Self-pay | Admitting: Internal Medicine

## 2012-11-04 ENCOUNTER — Ambulatory Visit (INDEPENDENT_AMBULATORY_CARE_PROVIDER_SITE_OTHER): Payer: Self-pay | Admitting: Internal Medicine

## 2012-11-04 ENCOUNTER — Encounter: Payer: Self-pay | Admitting: Internal Medicine

## 2012-11-04 VITALS — BP 115/69 | HR 78 | Temp 98.2°F | Ht 67.0 in | Wt 148.0 lb

## 2012-11-04 DIAGNOSIS — R000 Tachycardia, unspecified: Secondary | ICD-10-CM

## 2012-11-04 DIAGNOSIS — Z79899 Other long term (current) drug therapy: Secondary | ICD-10-CM

## 2012-11-04 DIAGNOSIS — B20 Human immunodeficiency virus [HIV] disease: Secondary | ICD-10-CM

## 2012-11-04 DIAGNOSIS — Z113 Encounter for screening for infections with a predominantly sexual mode of transmission: Secondary | ICD-10-CM

## 2012-11-04 DIAGNOSIS — Z23 Encounter for immunization: Secondary | ICD-10-CM

## 2012-11-04 NOTE — Assessment & Plan Note (Signed)
No current tachycardia at this time

## 2012-11-04 NOTE — Progress Notes (Signed)
  Subjective:    Patient ID: Douglas Valenzuela, male    DOB: 1990-10-26, 22 y.o.   MRN: [id number]  HPI He comes in for follow up of his HIV. He continues on Stribild and denies any missed doses. He has good compliance and tolerance. His CD4 remains stable and virus nearly undetectable.  No new issues   Review of Systems  Constitutional: Negative for fatigue and unexpected weight change.  HENT: Negative for sore throat and trouble swallowing.   Gastrointestinal: Negative for nausea, abdominal pain and diarrhea.  Skin: Negative for rash.  Neurological: Negative for dizziness and headaches.  Psychiatric/Behavioral: The patient is not nervous/anxious.        Objective:   Physical Exam  Constitutional: He appears well-developed and well-nourished. No distress.  Cardiovascular: Normal rate, regular rhythm and normal heart sounds.  Exam reveals no gallop and no friction rub.   No murmur heard. Pulmonary/Chest: Effort normal and breath sounds normal. No respiratory distress. He has no wheezes. He has no rales.  Lymphadenopathy:    He has no cervical adenopathy.          Assessment & Plan:

## 2012-11-04 NOTE — Assessment & Plan Note (Signed)
He continues to do well and will continue a Stribild. He will return in about 4 months for labs. He was offered and given condoms

## 2012-11-11 ENCOUNTER — Ambulatory Visit: Payer: Self-pay

## 2012-12-22 ENCOUNTER — Ambulatory Visit: Payer: Self-pay

## 2013-02-17 ENCOUNTER — Other Ambulatory Visit: Payer: Self-pay

## 2013-03-03 ENCOUNTER — Ambulatory Visit (INDEPENDENT_AMBULATORY_CARE_PROVIDER_SITE_OTHER): Payer: Self-pay | Admitting: Internal Medicine

## 2013-03-03 ENCOUNTER — Encounter: Payer: Self-pay | Admitting: Internal Medicine

## 2013-03-03 VITALS — BP 130/71 | HR 64 | Temp 98.3°F | Ht 67.0 in | Wt 148.0 lb

## 2013-03-03 DIAGNOSIS — B20 Human immunodeficiency virus [HIV] disease: Secondary | ICD-10-CM

## 2013-03-03 DIAGNOSIS — Z113 Encounter for screening for infections with a predominantly sexual mode of transmission: Secondary | ICD-10-CM

## 2013-03-03 LAB — CBC WITH DIFFERENTIAL/PLATELET
Basophils Absolute: 0 10*3/uL (ref 0.0–0.1)
Basophils Relative: 0 % (ref 0–1)
Hemoglobin: 13.2 g/dL (ref 13.0–17.0)
MCHC: 33.6 g/dL (ref 30.0–36.0)
Monocytes Relative: 8 % (ref 3–12)
Neutro Abs: 1.7 10*3/uL (ref 1.7–7.7)
Neutrophils Relative %: 42 % — ABNORMAL LOW (ref 43–77)

## 2013-03-03 MED ORDER — ELVITEG-COBIC-EMTRICIT-TENOFDF 150-150-200-300 MG PO TABS: 1.0000 | ORAL_TABLET | Freq: Every day | ORAL | Status: AC

## 2013-03-03 NOTE — Progress Notes (Signed)
  Subjective:    Patient ID: Douglas Valenzuela, male    DOB: 09-May-1991, 22 y.o.   MRN: [id number]  HPI He comes in for followup of his HIV. He continues on Stribild and denies any missed doses except one time 2 weeks ago. He feels well and is pleased his current regimen. He did not have labs for his appointment we'll be giving them today.   Review of Systems  Constitutional: Negative for fatigue.  HENT: Negative for sore throat and trouble swallowing.   Gastrointestinal: Negative for diarrhea.  Skin: Negative for rash.  Neurological: Negative for dizziness, light-headedness and headaches.       Objective:   Physical Exam  Constitutional: He appears well-developed and well-nourished. No distress.  HENT:  Mouth/Throat: No oropharyngeal exudate.  Cardiovascular: Normal rate, regular rhythm and normal heart sounds.  Exam reveals no gallop and no friction rub.   No murmur heard. Pulmonary/Chest: Effort normal and breath sounds normal. No respiratory distress. He has no wheezes.  Lymphadenopathy:    He has no cervical adenopathy.          Assessment & Plan:

## 2013-03-03 NOTE — Assessment & Plan Note (Signed)
He is doing well. His labs rechecked and if there is any concerns he will be called back otherwise will follow up in 4 months.

## 2013-03-04 ENCOUNTER — Ambulatory Visit (INDEPENDENT_AMBULATORY_CARE_PROVIDER_SITE_OTHER): Payer: Self-pay | Admitting: Licensed Clinical Social Worker

## 2013-03-04 ENCOUNTER — Encounter: Payer: Self-pay | Admitting: *Deleted

## 2013-03-04 ENCOUNTER — Encounter: Payer: Self-pay | Admitting: Licensed Clinical Social Worker

## 2013-03-04 ENCOUNTER — Encounter: Payer: Self-pay | Admitting: Internal Medicine

## 2013-03-04 DIAGNOSIS — A539 Syphilis, unspecified: Secondary | ICD-10-CM

## 2013-03-04 LAB — COMPLETE METABOLIC PANEL WITH GFR
ALT: 14 U/L (ref 0–53)
AST: 30 U/L (ref 0–37)
Albumin: 4.2 g/dL (ref 3.5–5.2)
Alkaline Phosphatase: 49 U/L (ref 39–117)
Glucose, Bld: 82 mg/dL (ref 70–99)
Potassium: 3.9 mEq/L (ref 3.5–5.3)
Sodium: 137 mEq/L (ref 135–145)
Total Protein: 7.5 g/dL (ref 6.0–8.3)

## 2013-03-04 LAB — RPR TITER: RPR Titer: 1:128 {titer} — AB

## 2013-03-04 MED ORDER — PENICILLIN G BENZATHINE 1200000 UNIT/2ML IM SUSP
1.2000 10*6.[IU] | Freq: Once | INTRAMUSCULAR | Status: AC
  Administered 2013-03-04: 1.2 10*6.[IU] via INTRAMUSCULAR

## 2013-03-11 ENCOUNTER — Ambulatory Visit (INDEPENDENT_AMBULATORY_CARE_PROVIDER_SITE_OTHER): Payer: Self-pay

## 2013-03-11 DIAGNOSIS — A539 Syphilis, unspecified: Secondary | ICD-10-CM

## 2013-03-11 MED ORDER — PENICILLIN G BENZATHINE 1200000 UNIT/2ML IM SUSP
1.2000 10*6.[IU] | Freq: Once | INTRAMUSCULAR | Status: AC
  Administered 2013-03-11: 1.2 10*6.[IU] via INTRAMUSCULAR

## 2013-03-11 MED ORDER — PENICILLIN G BENZATHINE 1200000 UNIT/2ML IM SUSP
1.2000 10*6.[IU] | INTRAMUSCULAR | Status: DC
  Administered 2013-03-11: 1.2 10*6.[IU] via INTRAMUSCULAR

## 2013-03-18 ENCOUNTER — Ambulatory Visit: Payer: Self-pay

## 2013-03-21 ENCOUNTER — Ambulatory Visit (INDEPENDENT_AMBULATORY_CARE_PROVIDER_SITE_OTHER): Payer: Self-pay | Admitting: Licensed Clinical Social Worker

## 2013-03-21 DIAGNOSIS — A539 Syphilis, unspecified: Secondary | ICD-10-CM

## 2013-03-21 MED ORDER — PENICILLIN G BENZATHINE 1200000 UNIT/2ML IM SUSP
1.2000 10*6.[IU] | Freq: Once | INTRAMUSCULAR | Status: AC
  Administered 2013-03-21: 1.2 10*6.[IU] via INTRAMUSCULAR

## 2013-05-07 ENCOUNTER — Encounter: Payer: Self-pay | Admitting: Internal Medicine

## 2013-05-09 ENCOUNTER — Telehealth: Payer: Self-pay | Admitting: *Deleted

## 2013-05-09 NOTE — Telephone Encounter (Signed)
Patient c/o feeling very moody since starting Stribild, unsure if this is a side effect and told him I would forward to the MD. Asked him if he has anything going on that could be causing him to feel this way and he said maybe some small issues. Told him if it is not the medication he may want to consider seeing our therapist Bernette Redbird and I told him I would call him back after Dr. Luciana Axe responds. Douglas Valenzuela

## 2013-05-09 NOTE — Telephone Encounter (Signed)
It is not known for mood issues unless it is disrupting his sleep which can lead to that.

## 2013-05-10 NOTE — Telephone Encounter (Signed)
Patient notified and he will consider making an appt with Franne Forts once he checks his work schedule. Wendall Mola

## 2013-06-13 ENCOUNTER — Ambulatory Visit: Payer: Self-pay

## 2013-07-07 ENCOUNTER — Other Ambulatory Visit: Payer: Self-pay

## 2013-07-21 ENCOUNTER — Telehealth: Payer: Self-pay | Admitting: *Deleted

## 2013-07-21 ENCOUNTER — Ambulatory Visit: Payer: Self-pay | Admitting: Internal Medicine

## 2013-07-21 NOTE — Telephone Encounter (Signed)
Called patient regarding today's no show; he also no showed his lab appt. Rescheduled his lab and due to multiple cancellations asked him to reschedule his MD appt when he comes in for his lab. Douglas Valenzuela

## 2013-07-26 ENCOUNTER — Other Ambulatory Visit: Payer: Self-pay

## 2013-08-04 ENCOUNTER — Other Ambulatory Visit: Payer: Self-pay

## 2013-10-04 ENCOUNTER — Ambulatory Visit (INDEPENDENT_AMBULATORY_CARE_PROVIDER_SITE_OTHER): Payer: Self-pay | Admitting: Internal Medicine

## 2013-10-04 ENCOUNTER — Encounter: Payer: Self-pay | Admitting: Internal Medicine

## 2013-10-04 VITALS — BP 119/81 | HR 120 | Temp 100.2°F | Wt 155.0 lb

## 2013-10-04 DIAGNOSIS — Z79899 Other long term (current) drug therapy: Secondary | ICD-10-CM

## 2013-10-04 DIAGNOSIS — Z8619 Personal history of other infectious and parasitic diseases: Secondary | ICD-10-CM

## 2013-10-04 DIAGNOSIS — J09X2 Influenza due to identified novel influenza A virus with other respiratory manifestations: Secondary | ICD-10-CM | POA: Insufficient documentation

## 2013-10-04 DIAGNOSIS — Z113 Encounter for screening for infections with a predominantly sexual mode of transmission: Secondary | ICD-10-CM

## 2013-10-04 DIAGNOSIS — B20 Human immunodeficiency virus [HIV] disease: Secondary | ICD-10-CM

## 2013-10-04 LAB — COMPLETE METABOLIC PANEL WITH GFR
ALBUMIN: 4.5 g/dL (ref 3.5–5.2)
ALK PHOS: 35 U/L — AB (ref 39–117)
ALT: 14 U/L (ref 0–53)
AST: 26 U/L (ref 0–37)
BUN: 8 mg/dL (ref 6–23)
CO2: 25 mEq/L (ref 19–32)
CREATININE: 1.3 mg/dL (ref 0.50–1.35)
Calcium: 8.8 mg/dL (ref 8.4–10.5)
Chloride: 104 mEq/L (ref 96–112)
GFR, Est African American: 89 mL/min
GFR, Est Non African American: 77 mL/min
Glucose, Bld: 102 mg/dL — ABNORMAL HIGH (ref 70–99)
POTASSIUM: 4 meq/L (ref 3.5–5.3)
Sodium: 136 mEq/L (ref 135–145)
Total Bilirubin: 0.4 mg/dL (ref 0.3–1.2)
Total Protein: 7.6 g/dL (ref 6.0–8.3)

## 2013-10-04 LAB — CBC WITH DIFFERENTIAL/PLATELET
Basophils Absolute: 0 10*3/uL (ref 0.0–0.1)
Basophils Relative: 0 % (ref 0–1)
EOS PCT: 0 % (ref 0–5)
Eosinophils Absolute: 0 10*3/uL (ref 0.0–0.7)
HEMATOCRIT: 37.8 % — AB (ref 39.0–52.0)
Hemoglobin: 13.5 g/dL (ref 13.0–17.0)
LYMPHS PCT: 15 % (ref 12–46)
Lymphs Abs: 0.6 10*3/uL — ABNORMAL LOW (ref 0.7–4.0)
MCH: 30.9 pg (ref 26.0–34.0)
MCHC: 35.7 g/dL (ref 30.0–36.0)
MCV: 86.5 fL (ref 78.0–100.0)
MONO ABS: 0.7 10*3/uL (ref 0.1–1.0)
Monocytes Relative: 17 % — ABNORMAL HIGH (ref 3–12)
Neutro Abs: 2.7 10*3/uL (ref 1.7–7.7)
Neutrophils Relative %: 68 % (ref 43–77)
Platelets: 162 10*3/uL (ref 150–400)
RBC: 4.37 MIL/uL (ref 4.22–5.81)
RDW: 13.3 % (ref 11.5–15.5)
WBC: 4 10*3/uL (ref 4.0–10.5)

## 2013-10-04 MED ORDER — OSELTAMIVIR PHOSPHATE 75 MG PO CAPS: 75.0000 mg | ORAL_CAPSULE | Freq: Two times a day (BID) | ORAL | Status: AC

## 2013-10-04 NOTE — Assessment & Plan Note (Signed)
Will recheck RPR 

## 2013-10-04 NOTE — Assessment & Plan Note (Signed)
Will check labs today.  Will schedule follow up but patient to call back and let us know if/when he goes to [facility] so we can take him off our list.  Will call pt with any abnormal labs.

## 2013-10-04 NOTE — Assessment & Plan Note (Signed)
Influenza like illness.  Will start Tamiflu empirically.  Supportive care, fluids.  Given note to stay out of work this week.

## 2013-10-04 NOTE — Progress Notes (Signed)
   Subjective:    Patient ID: Douglas Valenzuela, male    DOB: 22-Nov-1990, 23 y.o.   MRN: [id number]  HPI Here for a work in visit.  Yesterday developed fever, myalgias, cough, diarrhea.  Works in an assisted living facility.  Fever today to 100.2.  Poor po.  Takes Stribild with no missed doses.  Has sporadic follow up due to workign 2 jobs.  Also is interested in transfering care to [facility] where his partner goes but has not yet done that.     Review of Systems  Constitutional: Positive for fever, chills and appetite change. Negative for fatigue.  Respiratory: Positive for cough. Negative for shortness of breath.   Gastrointestinal: Positive for diarrhea and abdominal distention. Negative for nausea.  Musculoskeletal: Positive for myalgias.  Skin: Negative for rash.  Neurological: Positive for headaches. Negative for dizziness and light-headedness.  Hematological: Negative for adenopathy.       Objective:   Physical Exam  Constitutional: He appears well-developed and well-nourished.  HENT:  Mouth/Throat: No oropharyngeal exudate.  Eyes: No scleral icterus.  Cardiovascular: Normal rate, regular rhythm and normal heart sounds.   No murmur heard. Pulmonary/Chest: Effort normal and breath sounds normal. No respiratory distress.  Lymphadenopathy:    He has no cervical adenopathy.  Skin: No rash noted.          Assessment & Plan:

## 2013-10-05 ENCOUNTER — Telehealth: Payer: Self-pay | Admitting: *Deleted

## 2013-10-05 LAB — RPR: RPR Ser Ql: REACTIVE — AB

## 2013-10-05 LAB — RPR TITER: RPR Titer: 1:16 {titer} — AB

## 2013-10-05 LAB — T.PALLIDUM AB, TOTAL

## 2013-10-05 NOTE — Telephone Encounter (Signed)
Patient is taking Tamiflu as prescribed, has not had a fever since he last took antipyretic at 2:00 pm 1/6.  Patient states he is feeling much better, would like to return to work as soon as possible.  Pt states he cannot afford to stay out of work until 1/12.  Pt needs a note stating that he is well enough to return to work.  Please advise if this is appropriate.   Andree CossHowell, Michelle M, RN

## 2013-10-06 ENCOUNTER — Encounter: Payer: Self-pay | Admitting: *Deleted

## 2013-10-06 LAB — HIV-1 RNA QUANT-NO REFLEX-BLD: HIV-1 RNA Quant, Log: <1.3 {Log} (ref <1.30)

## 2013-10-06 LAB — T-HELPER CELL (CD4) - (RCID CLINIC ONLY)
CD4 T CELL HELPER: 25 % — AB (ref 33–55)
CD4 T Cell Abs: 150 /uL — ABNORMAL LOW (ref 400–2700)

## 2013-10-06 NOTE — Telephone Encounter (Signed)
Yes, ok 

## 2013-10-06 NOTE — Telephone Encounter (Signed)
Will fax return to work note to [phone] attn David/Tara as directed by patient.

## 2014-01-26 IMAGING — CR DG CHEST 2V
2 series · 2 of 2 positions shown · non-contrast
Comparison: 02/09/2012 at [DATE] a.m.

CLINICAL DATA: Pleuritic right chest pain.

CHEST - 2 VIEW

[w chest pa]
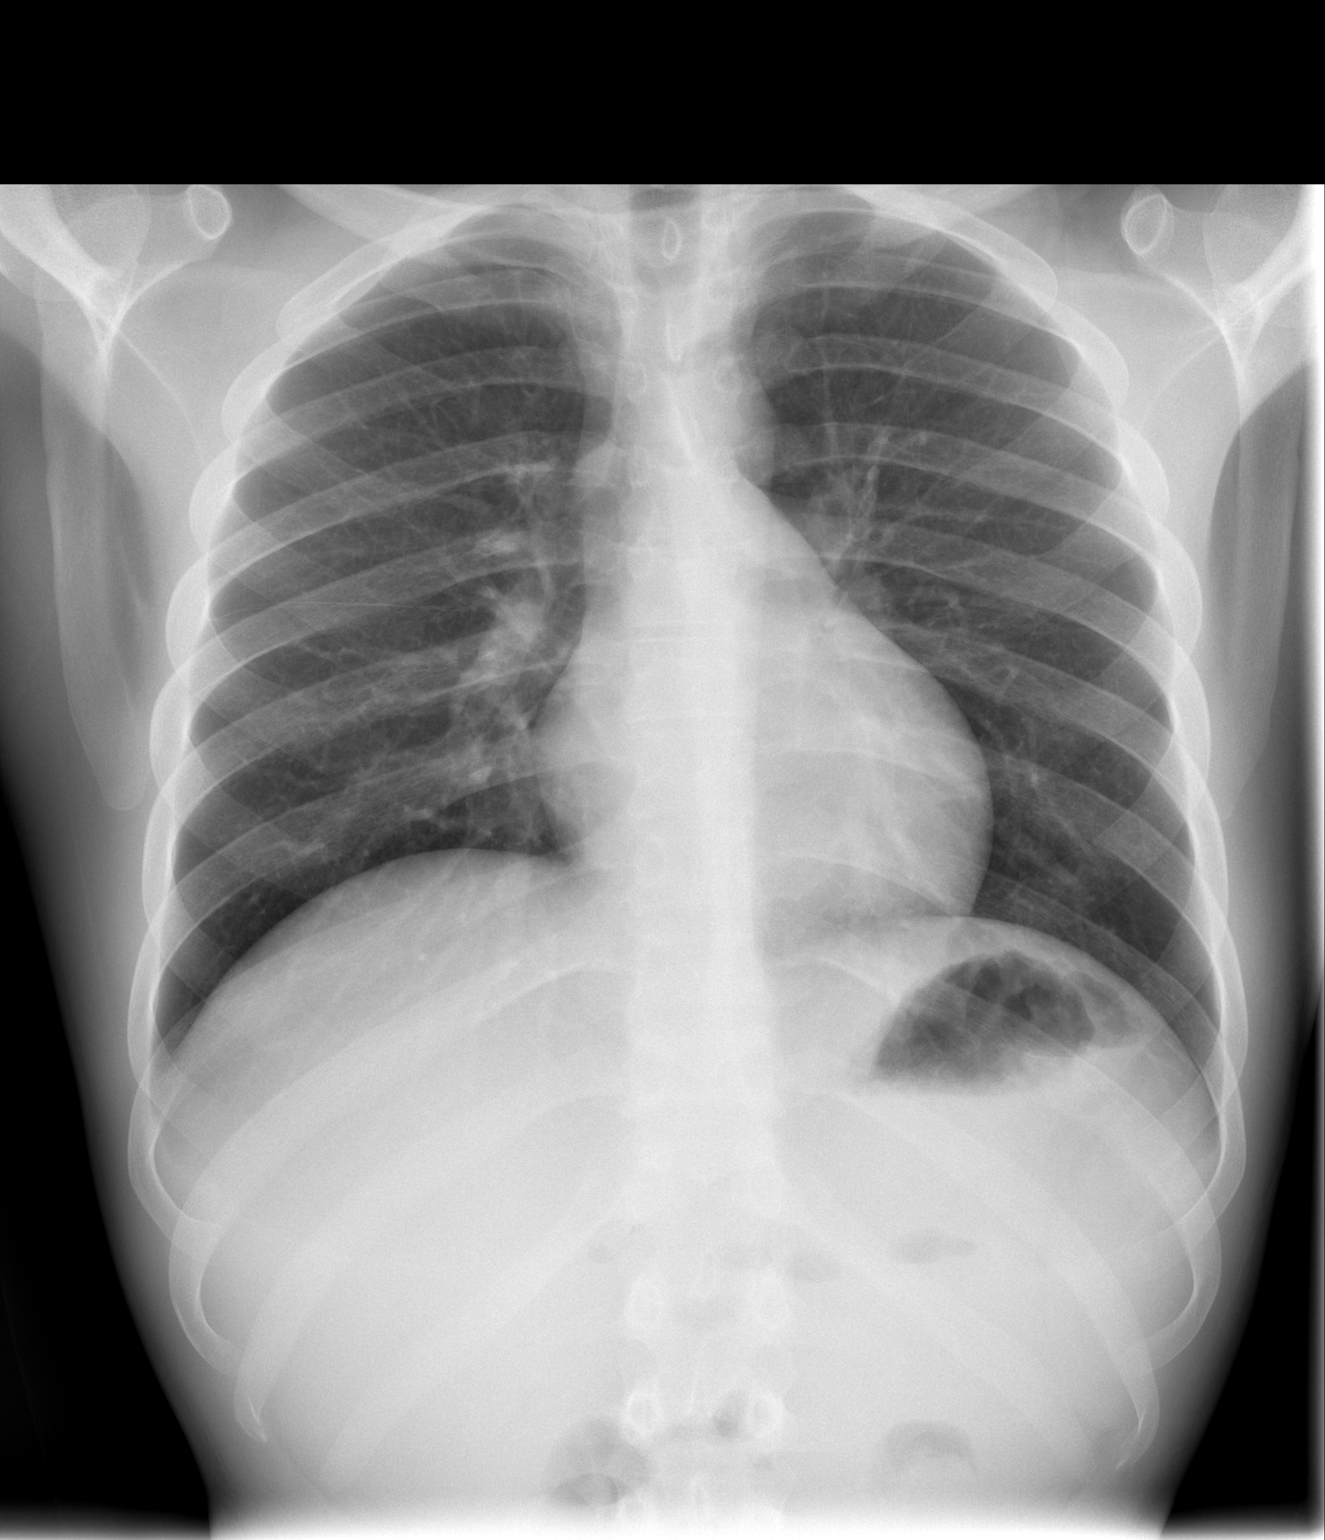

[w chest lat]
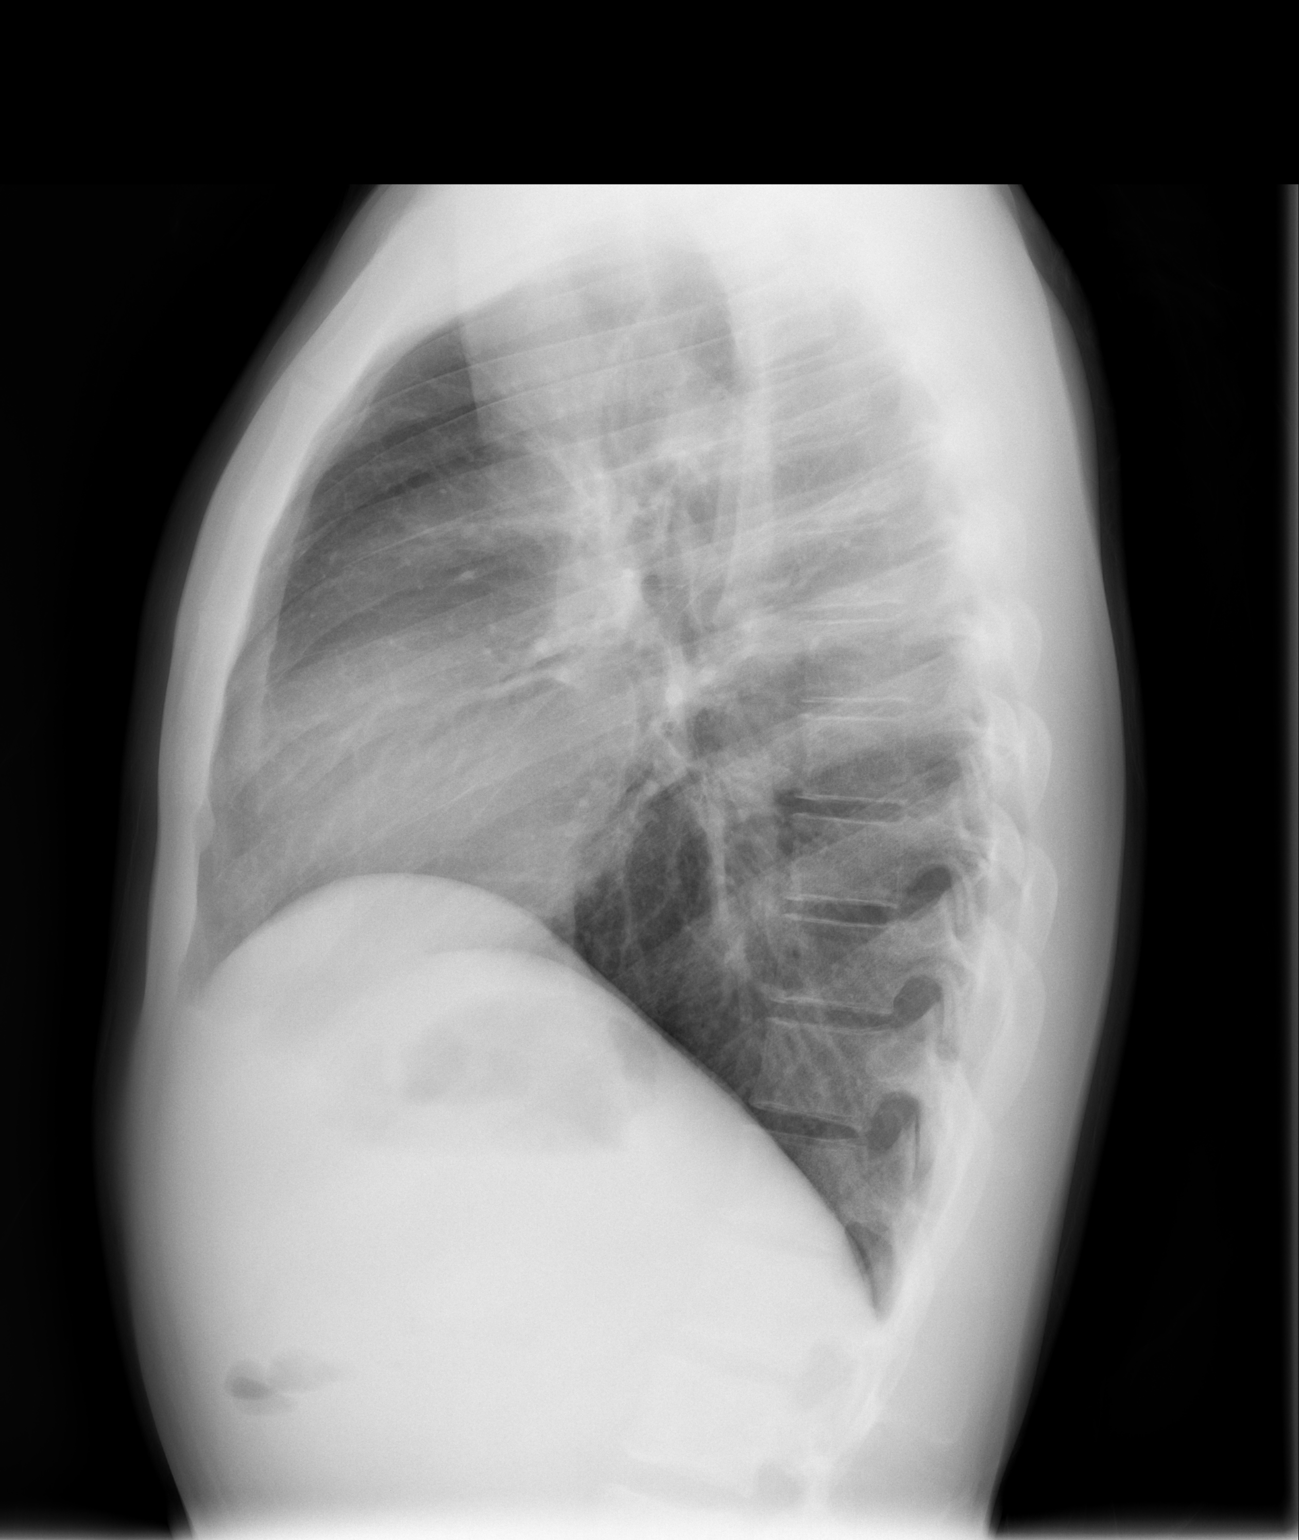

[2 of 2 positions shown; findings below may reference images not displayed]

FINDINGS: Cardiac and mediastinal contours appear normal.

The lungs appear clear.

No pleural effusion is identified.
IMPRESSION: No significant abnormality identified.

## 2014-02-23 IMAGING — CR DG CHEST 2V
2 series · 2 of 2 positions shown · non-contrast
Comparison: 02/09/2012.

CLINICAL DATA: Right upper chest pain.

CHEST - 2 VIEW

[w chest pa]
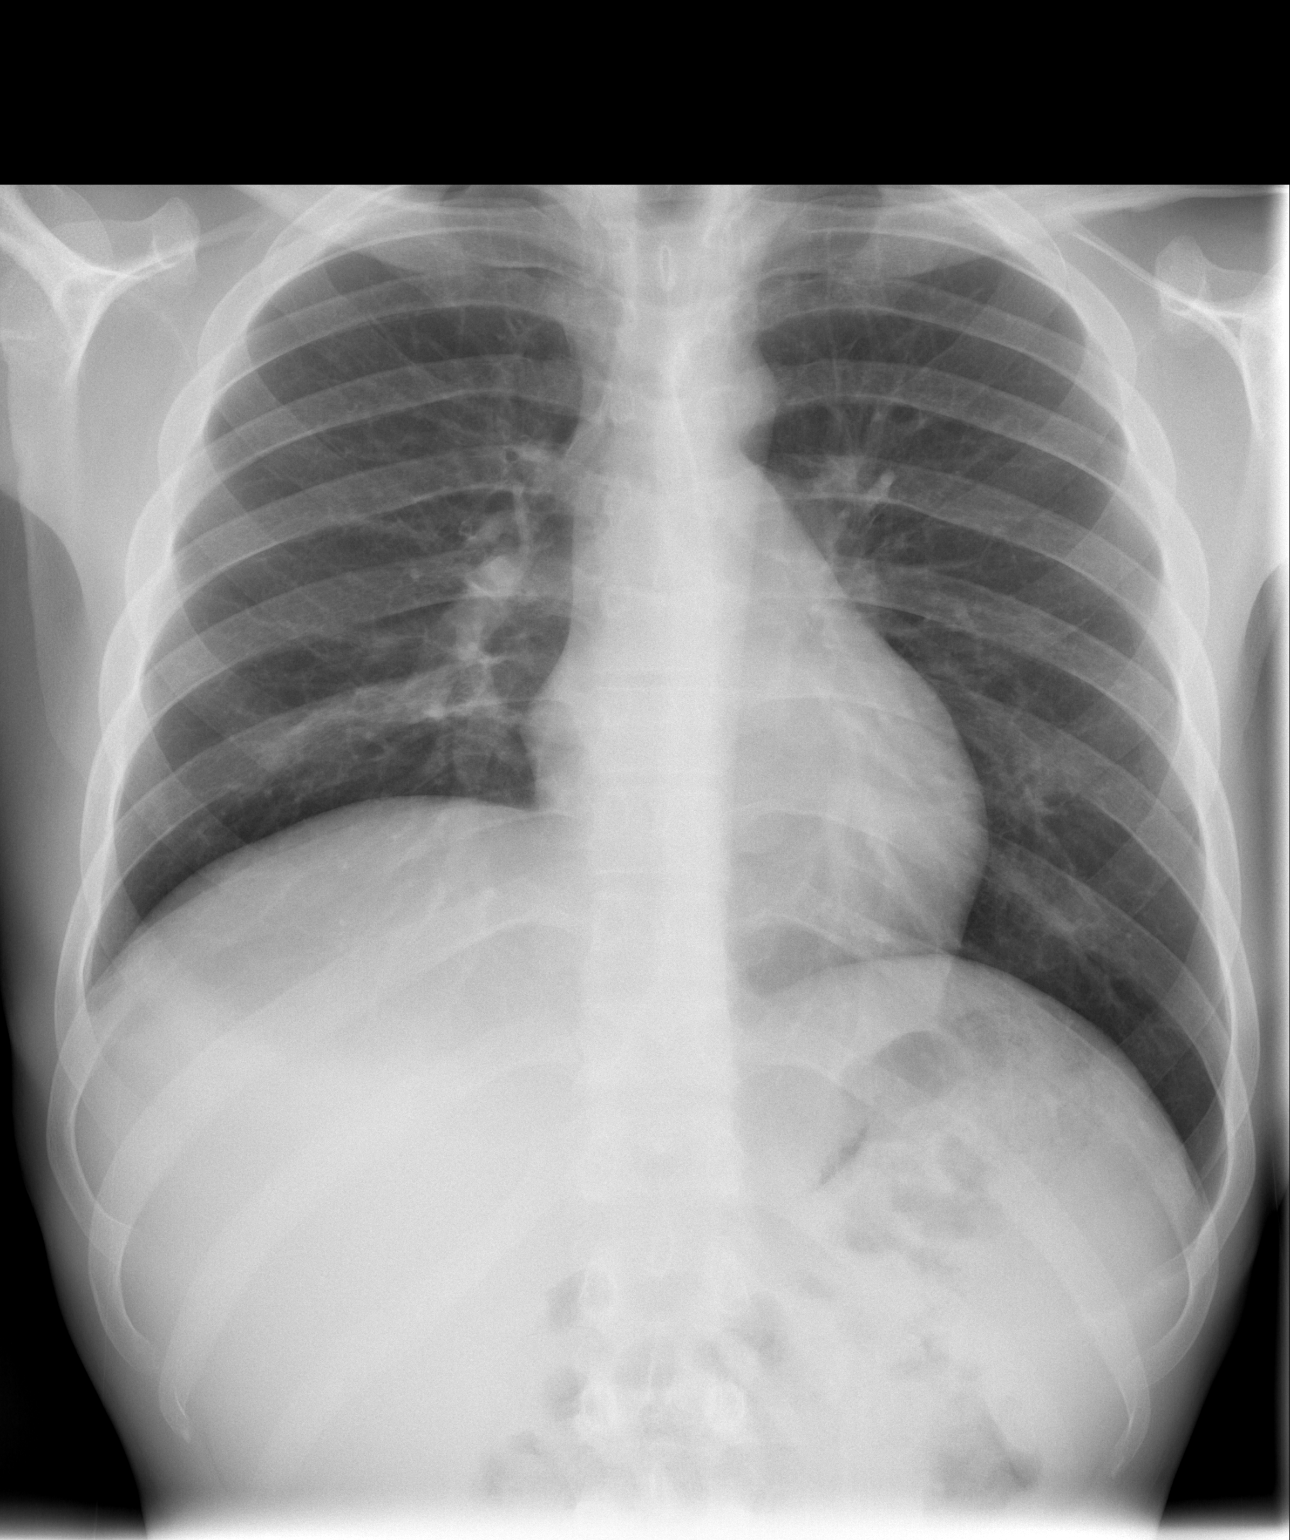

[w chest lat]
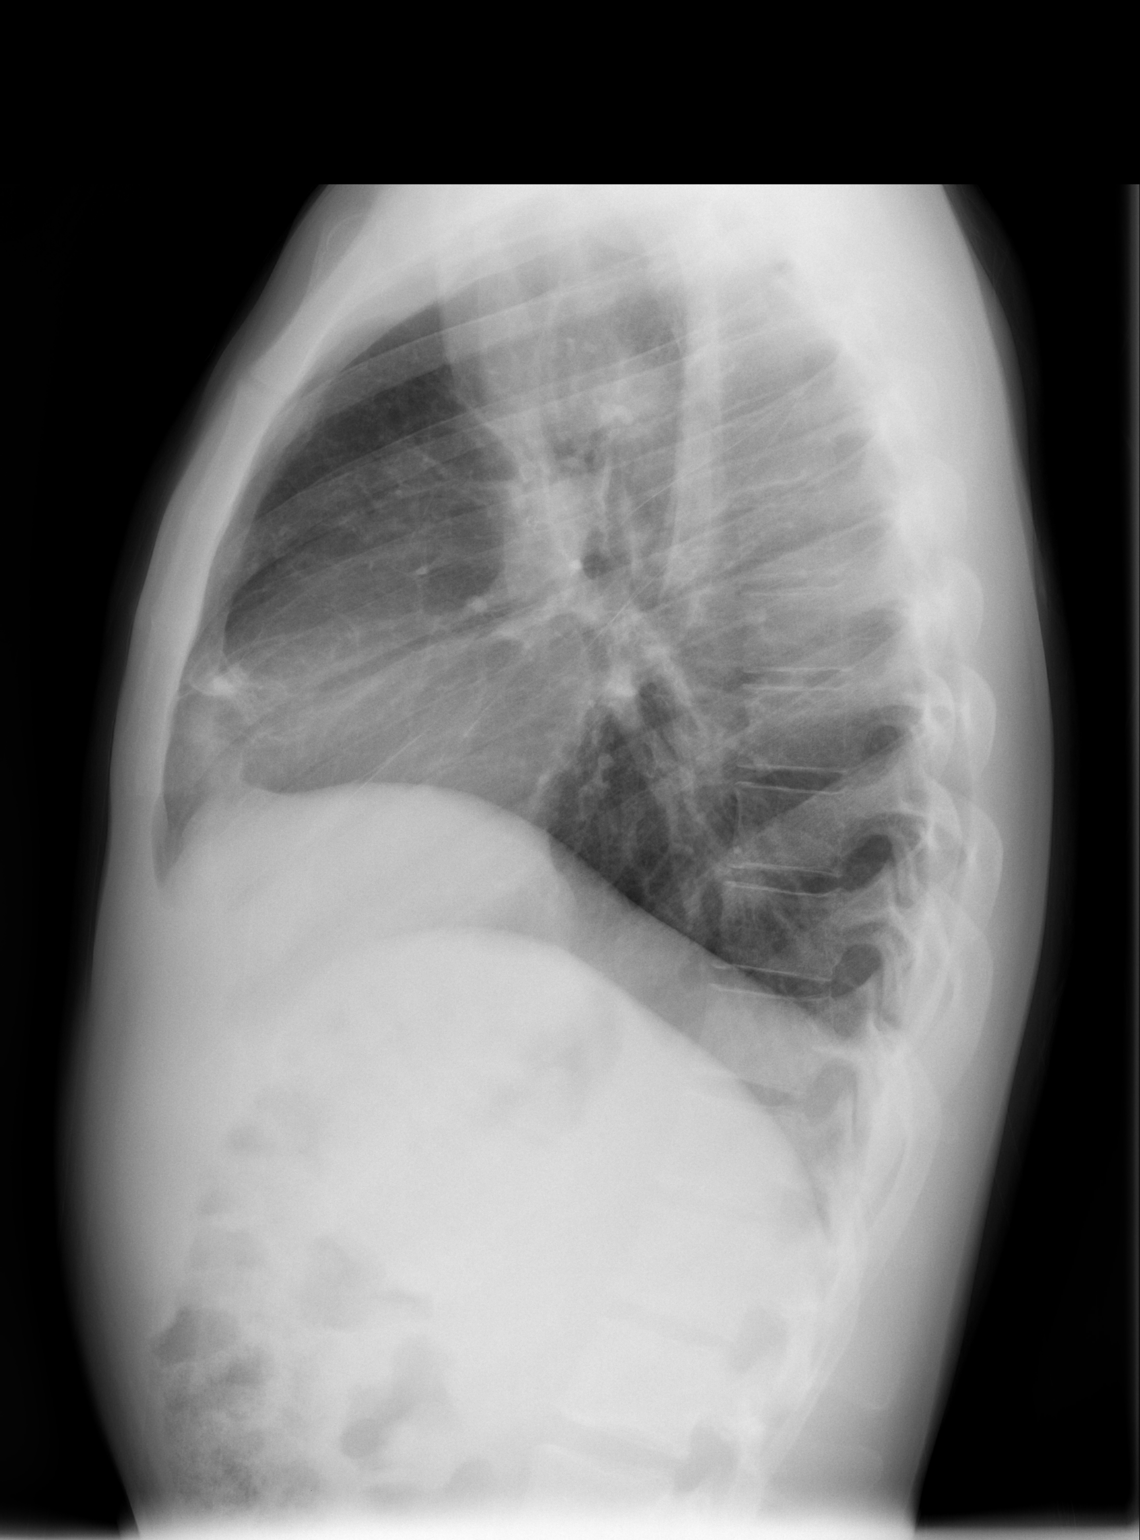

[2 of 2 positions shown; findings below may reference images not displayed]

FINDINGS: Asymmetric elevation of the right hemidiaphragm is more
pronounced on the current study than on the previous exam.  The
lungs are clear. The cardiopericardial silhouette is within normal
limits for size. Imaged bony structures of the thorax are intact.
IMPRESSION: Asymmetric elevation of the right hemidiaphragm has progressed in
the interval and may be related to splinting given the history of
right-sided pain. Phrenic nerve injury could also produce this
appearance.  Otherwise no acute cardiopulmonary process.

## 2014-05-24 ENCOUNTER — Telehealth: Payer: Self-pay | Admitting: *Deleted

## 2014-05-24 NOTE — Telephone Encounter (Signed)
Patient is transferring care to [facility].  Received signed ROI.  Will fax last office note and labs, send the signed release to medical records as requested. Andree Coss, RN

## 2014-06-23 ENCOUNTER — Telehealth: Payer: Self-pay | Admitting: Internal Medicine

## 2014-06-23 NOTE — Telephone Encounter (Signed)
Patient called ( called Douglas Valenzuela)) requesting I fill out a form to allow vaccination of Varivax for his employer.  He was last seen in January 2015 and was transferring care and CD4 150.  He will need to come in for a new appt to [facility] care prior to any evaluation for forms.  Also not appropriate to vaccinate with immunosuppressed status.

## 2014-06-26 ENCOUNTER — Telehealth: Payer: Self-pay | Admitting: *Deleted

## 2014-06-26 NOTE — Telephone Encounter (Signed)
Left message for the pt to call [facility] to make an appt for follow-up.  Has not been seen since 09/2013.

## 2014-06-26 NOTE — Telephone Encounter (Signed)
Faxed signed varivax vaccine recommendation to [facility] [employer] at [phone] per patient's request. Per Dr. Luciana Axe, patient should not receive the vaccine.  Patient should follow up as scheduled with his new provider at [facility]. Andree Coss, RN

## 2014-06-27 ENCOUNTER — Emergency Department (HOSPITAL_BASED_OUTPATIENT_CLINIC_OR_DEPARTMENT_OTHER)
Admission: EM | Admit: 2014-06-27 | Discharge: 2014-06-27 | Disposition: A | Discharge status: Home / Self Care | Payer: Self-pay | Attending: Emergency Medicine | Admitting: Emergency Medicine

## 2014-06-27 ENCOUNTER — Encounter (HOSPITAL_BASED_OUTPATIENT_CLINIC_OR_DEPARTMENT_OTHER): Payer: Self-pay | Admitting: Emergency Medicine

## 2014-06-27 DIAGNOSIS — Z21 Asymptomatic human immunodeficiency virus [HIV] infection status: Secondary | ICD-10-CM | POA: Insufficient documentation

## 2014-06-27 DIAGNOSIS — K529 Noninfective gastroenteritis and colitis, unspecified: Secondary | ICD-10-CM

## 2014-06-27 DIAGNOSIS — F17200 Nicotine dependence, unspecified, uncomplicated: Secondary | ICD-10-CM | POA: Insufficient documentation

## 2014-06-27 DIAGNOSIS — K5289 Other specified noninfective gastroenteritis and colitis: Secondary | ICD-10-CM | POA: Insufficient documentation

## 2014-06-27 DIAGNOSIS — R197 Diarrhea, unspecified: Secondary | ICD-10-CM | POA: Insufficient documentation

## 2014-06-27 DIAGNOSIS — Z79899 Other long term (current) drug therapy: Secondary | ICD-10-CM | POA: Insufficient documentation

## 2014-06-27 LAB — BASIC METABOLIC PANEL
ANION GAP: 12 (ref 5–15)
BUN: 12 mg/dL (ref 6–23)
CO2: 26 mEq/L (ref 19–32)
Calcium: 9.5 mg/dL (ref 8.4–10.5)
Chloride: 101 mEq/L (ref 96–112)
Creatinine, Ser: 1 mg/dL (ref 0.50–1.35)
Glucose, Bld: 102 mg/dL — ABNORMAL HIGH (ref 70–99)
Potassium: 3.7 mEq/L (ref 3.7–5.3)
SODIUM: 139 meq/L (ref 137–147)

## 2014-06-27 LAB — URINALYSIS, ROUTINE W REFLEX MICROSCOPIC
Bilirubin Urine: NEGATIVE
GLUCOSE, UA: NEGATIVE mg/dL
HGB URINE DIPSTICK: NEGATIVE
KETONES UR: NEGATIVE mg/dL
LEUKOCYTES UA: NEGATIVE
Nitrite: NEGATIVE
PH: 7 (ref 5.0–8.0)
PROTEIN: NEGATIVE mg/dL
Specific Gravity, Urine: 1.008 (ref 1.005–1.030)
Urobilinogen, UA: 1 mg/dL (ref 0.0–1.0)

## 2014-06-27 MED ORDER — ACETAMINOPHEN 325 MG PO TABS
650.0000 mg | ORAL_TABLET | Freq: Once | ORAL | Status: AC
  Administered 2014-06-27: 650 mg via ORAL
  Filled 2014-06-27: qty 2

## 2014-06-27 MED ORDER — SODIUM CHLORIDE 0.9 % IV SOLN
Freq: Once | INTRAVENOUS | Status: AC
  Administered 2014-06-27: 999 mL/h via INTRAVENOUS

## 2014-06-27 MED ORDER — ONDANSETRON 8 MG PO TBDP: 8.0000 mg | ORAL_TABLET | Freq: Three times a day (TID) | ORAL | Status: AC | PRN

## 2014-06-27 NOTE — ED Provider Notes (Signed)
CSN: [id number]     Arrival date & time 06/27/14  1703 History   First MD Initiated Contact with Patient 06/27/14 1723     Chief Complaint  Patient presents with  . Diarrhea     (Consider location/radiation/quality/duration/timing/severity/associated sxs/prior Treatment) Patient is a 23 y.o. male presenting with diarrhea. The history is provided by the patient. No language interpreter was used.  Diarrhea Onset quality:  Gradual Duration:  8 hours Timing:  Intermittent Progression:  Worsening Relieved by:  None tried Associated symptoms: abdominal pain, chills, headaches and vomiting   Associated symptoms: no fever   Risk factors: suspect food intake    Douglas Valenzuela is a 23 y.o. male who presents to the ED with nausea, vomiting and diarrhea. He states that the symptoms started about 9 am. He has vomited x 2 and diarrhea x2. He thinks it may be due to something he ate last night. He states that his stomach feels bubbly. He is feeling a little better after drinking Gator Aid about 2:30 pm. He has not vomited or had diarrhea since then but just feels weak now and afraid he will start back again. 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 PMH significant for HIV infection but has done well and "CD4 count stays good".  Past Medical History  Diagnosis Date  . HIV (human immunodeficiency virus infection)   . Former smoker    Past Surgical History  Procedure Laterality Date  . Thoracotomy     Family History  Problem Relation Age of Onset  . Seizures Mother    History  Substance Use Topics  . Smoking status: Current Some Day Smoker    Types: Cigars  . Smokeless tobacco: Never Used  . Alcohol Use: 0.0 oz/week     Comment: monthly    Review of Systems  Constitutional: Positive for chills. Negative for fever.  Eyes: Negative for visual disturbance.  Respiratory: Negative for cough and wheezing.   Cardiovascular: Negative for  chest pain and palpitations.  Gastrointestinal: Positive for vomiting, abdominal pain and diarrhea.  Genitourinary: Negative for dysuria, urgency and frequency.  Musculoskeletal: Negative for back pain and neck pain.  Skin: Negative for rash.  Neurological: Positive for headaches. Negative for syncope and light-headedness.  Psychiatric/Behavioral: Negative for confusion. The patient is not nervous/anxious.       Allergies  Review of patient's allergies indicates no known allergies.  Home Medications   Prior to Admission medications   Medication Sig Start Date End Date Taking? Authorizing Provider  acetaminophen (TYLENOL) 500 MG tablet Take 500 mg by mouth every 4 (four) hours as needed. Patient used this medication for his fever.    Historical Provider, MD  elvitegravir-cobicistat-emtricitabine-tenofovir (STRIBILD) 150-150-200-300 MG TABS Take 1 tablet by mouth daily with breakfast. 03/03/13   Gardiner Barefootobert W Comer, MD  ondansetron (ZOFRAN ODT) 8 MG disintegrating tablet Take 1 tablet (8 mg total) by mouth every 8 (eight) hours as needed for nausea or vomiting. 06/27/14   Vinal Rosengrant Orlene OchM Hensley Aziz, NP  oseltamivir (TAMIFLU) 75 MG capsule Take 1 capsule (75 mg total) by mouth 2 (two) times daily. 10/04/13   Gardiner Barefootobert W Comer, MD   BP 140/92  Pulse 90  Temp(Src) 98.2 F (36.8 C) (Oral)  Resp 20  Ht 5\' 6"  (1.676 m)  Wt 140 lb (63.504 kg)  BMI 22.61 kg/m2  SpO2 100% Physical Exam  Nursing note and vitals reviewed. Constitutional: He is oriented to person, place,  and time. He appears well-developed and well-nourished.  HENT:  Head: Normocephalic.  Eyes: EOM are normal.  Neck: Neck supple.  Cardiovascular: Normal rate and regular rhythm.   Pulmonary/Chest: Effort normal and breath sounds normal.  Abdominal: Soft. Bowel sounds are increased. There is tenderness in the left lower quadrant. There is no CVA tenderness.  Musculoskeletal: Normal range of motion.  Neurological: He is alert and oriented to person,  place, and time. No cranial nerve deficit.  Skin: Skin is warm and dry.  Psychiatric: He has a normal mood and affect. His behavior is normal.    ED Course  Procedures  Results for orders placed during the hospital encounter of 06/27/14 (from the past 24 hour(s))  URINALYSIS, ROUTINE W REFLEX MICROSCOPIC     Status: None   Collection Time    06/27/14  5:29 PM      Result Value Ref Range   Color, Urine YELLOW  YELLOW   APPearance CLEAR  CLEAR   Specific Gravity, Urine 1.008  1.005 - 1.030   pH 7.0  5.0 - 8.0   Glucose, UA NEGATIVE  NEGATIVE mg/dL   Hgb urine dipstick NEGATIVE  NEGATIVE   Bilirubin Urine NEGATIVE  NEGATIVE   Ketones, ur NEGATIVE  NEGATIVE mg/dL   Protein, ur NEGATIVE  NEGATIVE mg/dL   Urobilinogen, UA 1.0  0.0 - 1.0 mg/dL   Nitrite NEGATIVE  NEGATIVE   Leukocytes, UA NEGATIVE  NEGATIVE  BASIC METABOLIC PANEL     Status: Abnormal   Collection Time    06/27/14  5:33 PM      Result Value Ref Range   Sodium 139  137 - 147 mEq/L   Potassium 3.7  3.7 - 5.3 mEq/L   Chloride 101  96 - 112 mEq/L   CO2 26  19 - 32 mEq/L   Glucose, Bld 102 (*) 70 - 99 mg/dL   BUN 12  6 - 23 mg/dL   Creatinine, Ser 1.61  0.50 - 1.35 mg/dL   Calcium 9.5  8.4 - 09.6 mg/dL   GFR calc non Af Amer >90  >90 mL/min   GFR calc Af Amer >90  >90 mL/min   Anion gap 12  5 - 15    MDM  @ 2030 patient feeling better, has had no nausea or vomiting since IV fluids and medication for nausea. He has had 2 diarrhea stools that he describes of brown watery but not as bad as earlier today. He is taking PO fluids and eating crackers without difficulty. Stable for discharge.  23 y.o. male with nausea, vomiting and diarrhea that started today. Stable for discharge without dehydration. He will follow up with his doctor and take a stool specimen for culture. He will return here as needed or if symptoms worsen.  Final diagnoses:  Gastroenteritis       Janne Napoleon, NP 06/27/14 2227

## 2014-06-27 NOTE — ED Notes (Addendum)
Diarrhea and nausea since this am. Chills. Vomited x 2.

## 2014-06-27 NOTE — ED Notes (Signed)
Pt given container for stool sample and instructed to take sample to his [facility].

## 2014-06-28 NOTE — ED Provider Notes (Signed)
Medical screening examination/treatment/procedure(s) were performed by non-physician practitioner and as supervising physician I was immediately available for consultation/collaboration.  Erlin Gardella T Jaquelinne Glendening, MD 06/28/14 0001 

## 2014-08-16 ENCOUNTER — Ambulatory Visit: Payer: Self-pay

## 2014-08-16 ENCOUNTER — Other Ambulatory Visit: Payer: Self-pay

## 2014-08-31 ENCOUNTER — Telehealth: Payer: Self-pay | Admitting: *Deleted

## 2014-08-31 ENCOUNTER — Ambulatory Visit: Payer: Self-pay | Admitting: Internal Medicine

## 2014-08-31 NOTE — Telephone Encounter (Signed)
Left message asking if patient was coming to his scheduled appointment today.  Per our records, patient was going to transfer care to [facility].  Asked him to please call back.

## 2023-11-16 ENCOUNTER — Telehealth: Payer: Self-pay

## 2023-11-16 NOTE — Telephone Encounter (Signed)
Per Barbara Cower of [facility] Department, patient needs an appointment to return to care. I called patient directly, requesting a call back.

## 2023-11-16 NOTE — Telephone Encounter (Signed)
Received voicemail from [place] with [facility]. Patient returning to [facility] from [place] and looking to re-establish care.   Ran out of ART December 2024. Presented to HD and tested positive for rectal chlamydia and RPR increased to 1:64. Per Barbara Cower, patient was treated with Bicillin and 7 days of doxycycline.   Front desk to reach out to patient and schedule appointment.   Sandie Ano, RN
